# Patient Record
Sex: Female | Born: 1980 | Race: White | Hispanic: No | Marital: Married | Smoking: Former smoker
Health system: Southern US, Community
[De-identification: ages and names within clinical notes are randomized; demographics above are authoritative.]

## PROBLEM LIST (undated history)

## (undated) ENCOUNTER — Inpatient Hospital Stay: Payer: Self-pay

## (undated) DIAGNOSIS — R5383 Other fatigue: Secondary | ICD-10-CM

## (undated) DIAGNOSIS — K5900 Constipation, unspecified: Secondary | ICD-10-CM

## (undated) DIAGNOSIS — R87629 Unspecified abnormal cytological findings in specimens from vagina: Secondary | ICD-10-CM

## (undated) DIAGNOSIS — T8859XA Other complications of anesthesia, initial encounter: Secondary | ICD-10-CM

## (undated) DIAGNOSIS — O24419 Gestational diabetes mellitus in pregnancy, unspecified control: Secondary | ICD-10-CM

## (undated) DIAGNOSIS — T4145XA Adverse effect of unspecified anesthetic, initial encounter: Secondary | ICD-10-CM

## (undated) DIAGNOSIS — I499 Cardiac arrhythmia, unspecified: Secondary | ICD-10-CM

## (undated) DIAGNOSIS — B977 Papillomavirus as the cause of diseases classified elsewhere: Secondary | ICD-10-CM

## (undated) HISTORY — DX: Gestational diabetes mellitus in pregnancy, unspecified control: O24.419

## (undated) HISTORY — DX: Papillomavirus as the cause of diseases classified elsewhere: B97.7

## (undated) HISTORY — DX: Unspecified abnormal cytological findings in specimens from vagina: R87.629

## (undated) HISTORY — DX: Constipation, unspecified: K59.00

## (undated) HISTORY — DX: Other fatigue: R53.83

---

## 2001-04-04 ENCOUNTER — Other Ambulatory Visit: Admission: RE | Admit: 2001-04-04 | Discharge: 2001-04-04 | Payer: Self-pay | Admitting: Family Medicine

## 2002-05-21 HISTORY — PX: TONSILLECTOMY: SUR1361

## 2004-08-03 ENCOUNTER — Ambulatory Visit: Payer: Self-pay | Admitting: Unknown Physician Specialty

## 2005-11-04 ENCOUNTER — Other Ambulatory Visit: Payer: Self-pay

## 2005-11-04 ENCOUNTER — Emergency Department: Payer: Self-pay | Admitting: Unknown Physician Specialty

## 2006-01-24 ENCOUNTER — Ambulatory Visit: Payer: Self-pay | Admitting: Unknown Physician Specialty

## 2008-03-16 ENCOUNTER — Ambulatory Visit: Payer: Self-pay

## 2008-11-18 ENCOUNTER — Observation Stay: Payer: Self-pay | Admitting: Obstetrics & Gynecology

## 2009-03-16 ENCOUNTER — Observation Stay: Payer: Self-pay | Admitting: Unknown Physician Specialty

## 2009-03-17 ENCOUNTER — Inpatient Hospital Stay: Payer: Self-pay | Admitting: Obstetrics and Gynecology

## 2011-07-21 ENCOUNTER — Ambulatory Visit: Payer: Self-pay | Admitting: Internal Medicine

## 2014-08-18 ENCOUNTER — Encounter: Payer: Self-pay | Admitting: *Deleted

## 2014-12-30 ENCOUNTER — Other Ambulatory Visit: Payer: Self-pay | Admitting: Obstetrics and Gynecology

## 2014-12-30 ENCOUNTER — Other Ambulatory Visit: Payer: 59

## 2014-12-30 DIAGNOSIS — N926 Irregular menstruation, unspecified: Secondary | ICD-10-CM

## 2014-12-31 LAB — BETA HCG QUANT (REF LAB): HCG QUANT: 30 m[IU]/mL

## 2015-01-04 ENCOUNTER — Other Ambulatory Visit: Payer: Self-pay | Admitting: Obstetrics and Gynecology

## 2015-01-04 DIAGNOSIS — N926 Irregular menstruation, unspecified: Secondary | ICD-10-CM

## 2015-01-18 ENCOUNTER — Ambulatory Visit: Payer: 59

## 2015-01-18 DIAGNOSIS — N926 Irregular menstruation, unspecified: Secondary | ICD-10-CM

## 2015-02-01 ENCOUNTER — Other Ambulatory Visit: Payer: 59

## 2015-02-01 ENCOUNTER — Other Ambulatory Visit: Payer: Self-pay | Admitting: Obstetrics and Gynecology

## 2015-02-01 ENCOUNTER — Ambulatory Visit: Payer: 59

## 2015-02-01 VITALS — BP 96/62 | HR 88 | Wt 151.8 lb

## 2015-02-01 DIAGNOSIS — N926 Irregular menstruation, unspecified: Secondary | ICD-10-CM

## 2015-02-01 DIAGNOSIS — Z3491 Encounter for supervision of normal pregnancy, unspecified, first trimester: Secondary | ICD-10-CM

## 2015-02-01 NOTE — Progress Notes (Unsigned)
NOB nurse intake, all info was given to patient and all questions answered.  Panaroma info was given and she will let us know if she would like test.  Pt hasn't traveled outside of the [place], [place] info given

## 2015-02-02 LAB — CBC WITH DIFFERENTIAL/PLATELET
Basophils Absolute: 0 10*3/uL (ref 0.0–0.2)
Basos: 0 %
EOS (ABSOLUTE): 0.2 10*3/uL (ref 0.0–0.4)
EOS: 2 %
HEMATOCRIT: 38.5 % (ref 34.0–46.6)
HEMOGLOBIN: 12.8 g/dL (ref 11.1–15.9)
IMMATURE GRANS (ABS): 0 10*3/uL (ref 0.0–0.1)
Immature Granulocytes: 0 %
LYMPHS ABS: 2.4 10*3/uL (ref 0.7–3.1)
LYMPHS: 29 %
MCH: 30 pg (ref 26.6–33.0)
MCHC: 33.2 g/dL (ref 31.5–35.7)
MCV: 90 fL (ref 79–97)
Monocytes Absolute: 0.6 10*3/uL (ref 0.1–0.9)
Monocytes: 7 %
Neutrophils Absolute: 5.1 10*3/uL (ref 1.4–7.0)
Neutrophils: 62 %
Platelets: 317 10*3/uL (ref 150–379)
RBC: 4.26 x10E6/uL (ref 3.77–5.28)
RDW: 13.4 % (ref 12.3–15.4)
WBC: 8.4 10*3/uL (ref 3.4–10.8)

## 2015-02-02 LAB — URINALYSIS, ROUTINE W REFLEX MICROSCOPIC
Bilirubin, UA: NEGATIVE
GLUCOSE, UA: NEGATIVE
Ketones, UA: NEGATIVE
Leukocytes, UA: NEGATIVE
NITRITE UA: NEGATIVE
PH UA: 6 (ref 5.0–7.5)
RBC, UA: NEGATIVE
Specific Gravity, UA: >=1.03 — AB (ref 1.005–1.030)
UUROB: 0.2 mg/dL (ref 0.2–1.0)

## 2015-02-02 LAB — HEP, RPR, HIV PANEL
HEP B S AG: NEGATIVE
HIV SCREEN 4TH GENERATION: NONREACTIVE
RPR: NONREACTIVE

## 2015-02-02 LAB — VARICELLA ZOSTER ANTIBODY, IGG: VARICELLA: 1097 {index} (ref >165)

## 2015-02-02 LAB — ABO AND RH: Rh Factor: POSITIVE

## 2015-02-02 LAB — ANTIBODY SCREEN: Antibody Screen: NEGATIVE

## 2015-02-02 LAB — RUBELLA SCREEN: RUBELLA: 2.36 {index} (ref >0.99)

## 2015-02-03 LAB — GC/CHLAMYDIA PROBE AMP
Chlamydia trachomatis, NAA: NEGATIVE
Neisseria gonorrhoeae by PCR: NEGATIVE

## 2015-02-03 LAB — URINE CULTURE: ORGANISM ID, BACTERIA: NO GROWTH

## 2015-02-04 ENCOUNTER — Telehealth: Payer: Self-pay | Admitting: *Deleted

## 2015-02-11 NOTE — Telephone Encounter (Signed)
Discussed with pt about her nausea, she voiced understanding

## 2015-02-16 ENCOUNTER — Ambulatory Visit (INDEPENDENT_AMBULATORY_CARE_PROVIDER_SITE_OTHER): Payer: 59 | Admitting: Obstetrics and Gynecology

## 2015-02-16 ENCOUNTER — Encounter: Payer: Self-pay | Admitting: Obstetrics and Gynecology

## 2015-02-16 VITALS — BP 104/66 | HR 98 | Wt 156.0 lb

## 2015-02-16 DIAGNOSIS — O98311 Other infections with a predominantly sexual mode of transmission complicating pregnancy, first trimester: Secondary | ICD-10-CM

## 2015-02-16 DIAGNOSIS — A630 Anogenital (venereal) warts: Secondary | ICD-10-CM | POA: Diagnosis not present

## 2015-02-16 DIAGNOSIS — O98511 Other viral diseases complicating pregnancy, first trimester: Secondary | ICD-10-CM | POA: Diagnosis not present

## 2015-02-16 DIAGNOSIS — Z3491 Encounter for supervision of normal pregnancy, unspecified, first trimester: Secondary | ICD-10-CM | POA: Diagnosis not present

## 2015-02-16 LAB — POCT URINALYSIS DIPSTICK
Bilirubin, UA: NEGATIVE
Blood, UA: NEGATIVE
GLUCOSE UA: NEGATIVE
Ketones, UA: NEGATIVE
LEUKOCYTES UA: NEGATIVE
Nitrite, UA: NEGATIVE
PROTEIN UA: NEGATIVE
SPEC GRAV UA: 1.01
UROBILINOGEN UA: 0.2
pH, UA: 6

## 2015-02-16 NOTE — Progress Notes (Signed)
ROB- ob work in pt c/o ? Genital warts

## 2015-02-21 NOTE — Progress Notes (Signed)
GYNECOLOGY CLINIC PROGRESS NOTE  Subjective:     Lauren Davis is a 34 y.o. G41P1001 female at 10.[redacted] weeks gestation who presents for evaluation of an abnormal vaginal lesion. Symptoms have been present for 1 day. Notes that after showering she could palpate a small raised area on right labia minora. Vaginal symptoms: none.  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   The following portions of the patient's history were reviewed and updated as appropriate: allergies, current medications, past family history, past medical history, past social history, past surgical history and problem list.   Review of Systems Pertinent items noted in HPI and remainder of comprehensive ROS otherwise negative.    Objective:    BP 104/66 mmHg  Pulse 98  Wt 156 lb (70.761 kg)  LMP 12/08/2014 General appearance: alert and no distress Pelvic: external genitalia normal, positive findings: 2 small genital warts present, ~ 3 mm in size. , rectovaginal septum normal and urethra without abnormality or discharge.  Internal exam not done.  Extremities: extremities normal, atraumatic, no cyanosis or edema  Neuro: Grossly normal.    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    Plan:   Discussed that lesions were very small, not likely to grow during pregnancy.  Patient notes understanding, however still adamant about removal of warts. Procedure details as follows:      1) Area numbed with topical Hurricaine gel.     2) Excision of warts using sharp dissection.      3) Silver nitrate stick placed at area of incision for hemostasis.  Patient tolerated procedure well.  Symptomatic local care discussed.   Patient to f/u in 2 weeks for NOB visit as previously scheduled.  F/u sooner if needed.    Hildred Laser, MD [facility]

## 2015-02-22 ENCOUNTER — Ambulatory Visit (INDEPENDENT_AMBULATORY_CARE_PROVIDER_SITE_OTHER): Payer: 59 | Admitting: Obstetrics and Gynecology

## 2015-02-22 ENCOUNTER — Encounter: Payer: Self-pay | Admitting: Obstetrics and Gynecology

## 2015-02-22 VITALS — BP 123/64 | HR 85 | Ht 69.0 in | Wt 153.0 lb

## 2015-02-22 DIAGNOSIS — Z3491 Encounter for supervision of normal pregnancy, unspecified, first trimester: Secondary | ICD-10-CM

## 2015-02-22 NOTE — Progress Notes (Signed)
NOB physical, pt still c/o nausea and fatigue

## 2015-03-04 ENCOUNTER — Other Ambulatory Visit: Payer: Self-pay | Admitting: Obstetrics and Gynecology

## 2015-03-04 ENCOUNTER — Encounter: Payer: Self-pay | Admitting: Obstetrics and Gynecology

## 2015-03-18 ENCOUNTER — Encounter: Payer: Self-pay | Admitting: Obstetrics and Gynecology

## 2015-03-18 ENCOUNTER — Ambulatory Visit (INDEPENDENT_AMBULATORY_CARE_PROVIDER_SITE_OTHER): Payer: 59 | Admitting: Obstetrics and Gynecology

## 2015-03-18 VITALS — BP 110/66 | HR 88 | Ht 69.0 in | Wt 159.4 lb

## 2015-03-18 DIAGNOSIS — O98312 Other infections with a predominantly sexual mode of transmission complicating pregnancy, second trimester: Secondary | ICD-10-CM

## 2015-03-18 DIAGNOSIS — Z3492 Encounter for supervision of normal pregnancy, unspecified, second trimester: Secondary | ICD-10-CM

## 2015-03-18 DIAGNOSIS — A630 Anogenital (venereal) warts: Secondary | ICD-10-CM

## 2015-03-18 LAB — POCT URINALYSIS DIPSTICK
Bilirubin, UA: NEGATIVE
GLUCOSE UA: NEGATIVE
Ketones, UA: NEGATIVE
Leukocytes, UA: NEGATIVE
Nitrite, UA: NEGATIVE
Protein, UA: NEGATIVE
RBC UA: NEGATIVE
SPEC GRAV UA: 1.015
UROBILINOGEN UA: 0.2
pH, UA: 6.5

## 2015-03-18 MED ORDER — PODOFILOX 0.5 % EX GEL: Freq: Two times a day (BID) | CUTANEOUS | Status: DC

## 2015-03-18 NOTE — Progress Notes (Signed)
Patient ID: Flossie Dibbleina L Auriemma, female   DOB: 01/14/1981, 34 y.o.   MRN: [id number]  HPI: genital wart that has reappeared. Previously removed by Dr Valentino Saxonherry 2 weeks ago  A: Pelvic exam: VULVA: normal appearing vulva with no masses, tenderness or lesions, with one area right lower labial warts P: TCA 80% applied will add Folic Acid 1 gm daily Rx for condylox get next week  RTC prn  Lorrin Bodner Burr,CNM

## 2015-03-20 ENCOUNTER — Observation Stay
Admission: RE | Admit: 2015-03-20 | Discharge: 2015-03-20 | Disposition: A | Discharge status: Home / Self Care | Payer: 59 | Attending: Obstetrics and Gynecology | Admitting: Obstetrics and Gynecology

## 2015-03-20 DIAGNOSIS — Z3A14 14 weeks gestation of pregnancy: Secondary | ICD-10-CM | POA: Diagnosis not present

## 2015-03-20 DIAGNOSIS — O4690 Antepartum hemorrhage, unspecified, unspecified trimester: Secondary | ICD-10-CM | POA: Diagnosis present

## 2015-03-20 DIAGNOSIS — O468X2 Other antepartum hemorrhage, second trimester: Secondary | ICD-10-CM | POA: Diagnosis not present

## 2015-03-20 MED ORDER — DOCUSATE SODIUM 100 MG PO CAPS
100.0000 mg | ORAL_CAPSULE | Freq: Every day | ORAL | Status: DC
Start: 2015-03-20 — End: 2015-03-20

## 2015-03-20 MED ORDER — ZOLPIDEM TARTRATE 5 MG PO TABS: 5.0000 mg | ORAL_TABLET | Freq: Every evening | ORAL | Status: DC | PRN

## 2015-03-20 MED ORDER — PRENATAL MULTIVITAMIN CH: 1.0000 | ORAL_TABLET | Freq: Every day | ORAL | Status: DC

## 2015-03-20 MED ORDER — ACETAMINOPHEN 325 MG PO TABS: 650.0000 mg | ORAL_TABLET | ORAL | Status: DC | PRN

## 2015-03-20 MED ORDER — CALCIUM CARBONATE ANTACID 500 MG PO CHEW: 2.0000 | CHEWABLE_TABLET | ORAL | Status: DC | PRN

## 2015-03-20 NOTE — OB Triage Note (Signed)
Dr D talked with pt. To se  On Monday

## 2015-03-20 NOTE — Final Progress Note (Signed)
Physician Final Progress Note  Patient ID: Lauren Davis MRN: [id number] DOB/AGE: 34/06/1980 34 y.o.  Admit date: 03/20/2015 Admitting provider: Herold HarmsMartin A Dequane Strahan, MD Discharge date: 03/20/2015   Admission Diagnoses:  1. 14.4 week IUP 2. Vaginal bleeding and cramping  Discharge Diagnoses:  Firrst Trimester Bleeding, history of Fetal well being confirmed  Consults: None   Significant Findings/ Diagnostic Studies:  U/S: +FM; [id number] PE: Sterile Speculum-No Bleeding; Cervix: Long/Closed/nontender; Uterus 15 week size, non tender A+ Blood type  Procedures: none  Discharge Condition: good  Disposition:   Diet: Regular diet  Discharge Activity: Activity as tolerated; SAB/CI precautions given     Medication List    ASK your doctor about these medications        docusate sodium 100 MG capsule  Commonly known as:  COLACE  Take 100 mg by mouth 2 (two) times daily.     podofilox 0.5 % gel  Commonly known as:  CONDYLOX  Apply topically 2 (two) times daily.     prenatal multivitamin Tabs tablet  Take 1 tablet by mouth daily at 12 noon.         Total time spent taking care of this patient: 20 minutes  Signed: Prentice DockerMartin A Aman Bonet 03/20/2015, 2:46 PM

## 2015-03-29 ENCOUNTER — Ambulatory Visit (INDEPENDENT_AMBULATORY_CARE_PROVIDER_SITE_OTHER): Payer: 59 | Admitting: Obstetrics and Gynecology

## 2015-03-29 ENCOUNTER — Encounter: Payer: 59 | Admitting: Obstetrics and Gynecology

## 2015-03-29 ENCOUNTER — Encounter: Payer: Self-pay | Admitting: Obstetrics and Gynecology

## 2015-03-29 VITALS — BP 110/62 | HR 87 | Wt 157.4 lb

## 2015-03-29 DIAGNOSIS — Z3492 Encounter for supervision of normal pregnancy, unspecified, second trimester: Secondary | ICD-10-CM

## 2015-03-29 LAB — POCT URINALYSIS DIPSTICK
BILIRUBIN UA: NEGATIVE
GLUCOSE UA: NEGATIVE
Ketones, UA: NEGATIVE
Leukocytes, UA: NEGATIVE
Nitrite, UA: NEGATIVE
PH UA: 6.5
Protein, UA: NEGATIVE
RBC UA: NEGATIVE
SPEC GRAV UA: 1.015
Urobilinogen, UA: 0.2

## 2015-03-29 NOTE — Progress Notes (Signed)
ROB-pt currently has a "cold", nasal drainage and coughing-she is taking sudafed x 1 week

## 2015-03-29 NOTE — Patient Instructions (Signed)
Vaginal Bleeding During Pregnancy, Second Trimester A small amount of bleeding (spotting) from the vagina is relatively common in pregnancy. It usually stops on its own. Various things can cause bleeding or spotting in pregnancy. Some bleeding may be related to the pregnancy, and some may not. Sometimes the bleeding is normal and is not a problem. However, bleeding can also be a sign of something serious. Be sure to tell your health care provider about any vaginal bleeding right away. Some possible causes of vaginal bleeding during the second trimester include:  Infection, inflammation, or growths on the cervix.   The placenta may be partially or completely covering the opening of the cervix inside the uterus (placenta previa).  The placenta may have separated from the uterus (abruption of the placenta).   You may be having early (preterm) labor.   The cervix may not be strong enough to keep a baby inside the uterus (cervical insufficiency).   Tiny cysts may have developed in the uterus instead of pregnancy tissue (molar pregnancy). HOME CARE INSTRUCTIONS  Watch your condition for any changes. The following actions may help to lessen any discomfort you are feeling:  Follow your health care provider's instructions for limiting your activity. If your health care provider orders bed rest, you may need to stay in bed and only get up to use the bathroom. However, your health care provider may allow you to continue light activity.  If needed, make plans for someone to help with your regular activities and responsibilities while you are on bed rest.  Keep track of the number of pads you use each day, how often you change pads, and how soaked (saturated) they are. Write this down.  Do not use tampons. Do not douche.  Do not have sexual intercourse or orgasms until approved by your health care provider.  If you pass any tissue from your vagina, save the tissue so you can show it to your  health care provider.  Only take over-the-counter or prescription medicines as directed by your health care provider.  Do not take aspirin because it can make you bleed.  Do not exercise or perform any strenuous activities or heavy lifting without your health care provider's permission.  Keep all follow-up appointments as directed by your health care provider. SEEK MEDICAL CARE IF:  You have any vaginal bleeding during any part of your pregnancy.  You have cramps or labor pains.  You have a fever, not controlled by medicine. SEEK IMMEDIATE MEDICAL CARE IF:   You have severe cramps in your back or belly (abdomen).  You have contractions.  You have chills.  You pass large clots or tissue from your vagina.  Your bleeding increases.  You feel light-headed or weak, or you have fainting episodes.  You are leaking fluid or have a gush of fluid from your vagina. MAKE SURE YOU:  Understand these instructions.  Will watch your condition.  Will get help right away if you are not doing well or get worse.   This information is not intended to replace advice given to you by your health care provider. Make sure you discuss any questions you have with your health care provider.   Document Released: 02/14/2005 Document Revised: 05/12/2013 Document Reviewed: 01/12/2013 Elsevier Interactive Patient Education 2016 Elsevier Inc.  

## 2015-03-29 NOTE — Progress Notes (Signed)
ROB- Ok to keep taking sudafed as needed, bleeding precautions discussed; anatomy scan next visit

## 2015-04-28 ENCOUNTER — Encounter: Payer: Self-pay | Admitting: Obstetrics and Gynecology

## 2015-04-28 ENCOUNTER — Ambulatory Visit (INDEPENDENT_AMBULATORY_CARE_PROVIDER_SITE_OTHER): Payer: 59

## 2015-04-28 ENCOUNTER — Ambulatory Visit (INDEPENDENT_AMBULATORY_CARE_PROVIDER_SITE_OTHER): Payer: 59 | Admitting: Obstetrics and Gynecology

## 2015-04-28 VITALS — BP 113/69 | HR 89 | Wt 163.0 lb

## 2015-04-28 DIAGNOSIS — Z3492 Encounter for supervision of normal pregnancy, unspecified, second trimester: Secondary | ICD-10-CM

## 2015-04-28 LAB — POCT URINALYSIS DIPSTICK
Bilirubin, UA: NEGATIVE
Blood, UA: NEGATIVE
GLUCOSE UA: NEGATIVE
KETONES UA: NEGATIVE
Leukocytes, UA: NEGATIVE
Nitrite, UA: NEGATIVE
Protein, UA: NEGATIVE
SPEC GRAV UA: 1.01
Urobilinogen, UA: 0.2
pH, UA: 7

## 2015-04-28 NOTE — Progress Notes (Signed)
Indications:  Anatomy Findings:  Singleton intrauterine pregnancy is visualized with FHR at 144 BPM. Biometrics give an (U/S) Gestational age of [redacted] weeks and 4 days, and an (U/S) EDD of 09/11/15; this correlates with the clinically established EDD of 09/10/15.  Fetal presentation is vertex, spine posterior.  EFW: 349 grams ( 0 lbs. 12 oz. ). Placenta: Anterior, grade 0 and remote to cervix AFI: Adequate with MVP of 4.4 cm.  Anatomic survey is complete and normal; Gender - female.   Right Ovary measures 3.2 x 1.7 x 2.2 cm. It is normal in appearance. Left Ovary measures 3.2 x 2.2 x 3.0 cm. It is normal appearance. There is no evidence of a corpus luteal cyst. Survey of the adnexa demonstrates no adnexal masses. There is no free peritoneal fluid in the cul de sac.  Impression: 1. 20 weeks 4 day Viable Singleton Intrauterine pregnancy by U/S. 2. (U/S) EDD is consistent with Clinically established (LMP) EDD of 09/10/15. 3. Normal appearing anatomy scan.

## 2015-04-28 NOTE — Progress Notes (Signed)
ROB-pt had anatomy scan today, had episode of dizziness yesterday

## 2015-05-06 ENCOUNTER — Other Ambulatory Visit: Payer: Self-pay | Admitting: *Deleted

## 2015-05-06 ENCOUNTER — Other Ambulatory Visit: Payer: 59

## 2015-05-06 DIAGNOSIS — M545 Low back pain, unspecified: Secondary | ICD-10-CM

## 2015-05-06 DIAGNOSIS — O26892 Other specified pregnancy related conditions, second trimester: Principal | ICD-10-CM

## 2015-05-07 LAB — URINE CULTURE: ORGANISM ID, BACTERIA: NO GROWTH

## 2015-05-22 NOTE — L&D Delivery Note (Signed)
Delivery Summary for Lauren Davis  Labor Events:   Preterm labor:   Rupture date:   Rupture time:   Rupture type: Artificial  Fluid Color: Clear  Induction:   Augmentation:   Complications:   Cervical ripening:          Delivery:   Episiotomy:   Lacerations:   Repair suture:   Repair # of packets:   Blood loss (ml): 350   Information for the patient's newborn:  Lavada MesiBrannock, Collins Rose [[id number]][[id number]]    Delivery 09/05/2015 10:10 PM by  Vaginal, Spontaneous Delivery Sex:  female Gestational Age: 1766w2d Delivery Clinician:  Melody N Shambley Living?: Yes        APGARS  One minute Five minutes Ten minutes  Skin color: 0   1      Heart rate: 2   2      Grimace: 2   2      Muscle tone: 2   2      Breathing: 2   2      Totals: 8  9      Presentation/position:      Resuscitation: None  Cord information:    Disposition of cord blood:     Blood gases sent?  Complications:   Placenta: Delivered: 09/05/2015 10:24 PM  Spontaneous  Intact appearance Newborn Measurements: Weight: 7 lb 12 oz (3515 g)  Height: 20.5"  Head circumference:    Chest circumference:    Other providers: Delivery Nurse Transition RN Joy L Ardelle AntonWagoner Clifton JamesSusan K Frey  Additional  information: Forceps:   Vacuum:   Breech:   Observed anomalies           Delivery Note At  a viable and healthy female "Collins" was delivered via  (Presentation:OA ;  Vertex).  APGAR:8 ,9 ; weight 3515 grams .   Placenta status: delivered intact with three vessel Cord:  with the following complications:none. Anesthesia:   Episiotomy:  none Lacerations:  2nd degree Suture Repair: 3.0 vicryl rapide Est. Blood Loss (mL):  350  Mom to postpartum.  Baby to Couplet care / Skin to Skin.  Melody NIKE Shambley, CNM 09/05/2015, 10:41 PM

## 2015-05-26 ENCOUNTER — Ambulatory Visit (INDEPENDENT_AMBULATORY_CARE_PROVIDER_SITE_OTHER): Payer: 59 | Admitting: Obstetrics and Gynecology

## 2015-05-26 ENCOUNTER — Encounter: Payer: 59 | Admitting: Obstetrics and Gynecology

## 2015-05-26 ENCOUNTER — Encounter: Payer: Self-pay | Admitting: Obstetrics and Gynecology

## 2015-05-26 VITALS — BP 110/69 | HR 95 | Wt 168.5 lb

## 2015-05-26 DIAGNOSIS — Z331 Pregnant state, incidental: Secondary | ICD-10-CM

## 2015-05-26 LAB — POCT URINALYSIS DIPSTICK
Bilirubin, UA: NEGATIVE
Blood, UA: NEGATIVE
Glucose, UA: NEGATIVE
KETONES UA: NEGATIVE
LEUKOCYTES UA: NEGATIVE
Nitrite, UA: NEGATIVE
PROTEIN UA: NEGATIVE
Spec Grav, UA: 1.01
UROBILINOGEN UA: 0.2
pH, UA: 6

## 2015-05-26 NOTE — Progress Notes (Signed)
ROB-doing well, glucola next visit. 

## 2015-05-26 NOTE — Progress Notes (Signed)
ROB-pt denies any complaints 

## 2015-06-20 ENCOUNTER — Other Ambulatory Visit: Payer: Self-pay | Admitting: *Deleted

## 2015-06-20 DIAGNOSIS — Z331 Pregnant state, incidental: Secondary | ICD-10-CM

## 2015-06-20 DIAGNOSIS — Z131 Encounter for screening for diabetes mellitus: Secondary | ICD-10-CM

## 2015-06-21 ENCOUNTER — Encounter: Payer: Self-pay | Admitting: Obstetrics and Gynecology

## 2015-06-21 ENCOUNTER — Ambulatory Visit (INDEPENDENT_AMBULATORY_CARE_PROVIDER_SITE_OTHER): Payer: 59 | Admitting: Obstetrics and Gynecology

## 2015-06-21 VITALS — BP 98/68 | HR 98 | Wt 171.0 lb

## 2015-06-21 DIAGNOSIS — Z23 Encounter for immunization: Secondary | ICD-10-CM | POA: Diagnosis not present

## 2015-06-21 DIAGNOSIS — Z331 Pregnant state, incidental: Secondary | ICD-10-CM | POA: Diagnosis not present

## 2015-06-21 LAB — POCT URINALYSIS DIPSTICK
Bilirubin, UA: NEGATIVE
Blood, UA: NEGATIVE
GLUCOSE UA: NEGATIVE
KETONES UA: NEGATIVE
Leukocytes, UA: NEGATIVE
Nitrite, UA: NEGATIVE
SPEC GRAV UA: 1.015
Urobilinogen, UA: 0.2
pH, UA: 7

## 2015-06-21 MED ORDER — TETANUS-DIPHTH-ACELL PERTUSSIS 5-2.5-18.5 LF-MCG/0.5 IM SUSP
0.5000 mL | Freq: Once | INTRAMUSCULAR | Status: AC
  Administered 2015-06-21: 0.5 mL via INTRAMUSCULAR

## 2015-06-21 NOTE — Progress Notes (Signed)
ROB- doing well except intermittent vaginal irritation- used terazol with some relief then burning, Microscopic wet-mount exam shows negative for pathogens, normal epithelial cells, pH 4.5. Reassured; Blood consent signed, Tdap given, and info on cord blood donation discussed. Will do glucola one day this week.

## 2015-06-21 NOTE — Progress Notes (Signed)
OB work in- pt is c/o vaginal itching outside, otherwise pt is doing well

## 2015-06-22 ENCOUNTER — Other Ambulatory Visit: Payer: 59

## 2015-06-22 DIAGNOSIS — Z131 Encounter for screening for diabetes mellitus: Secondary | ICD-10-CM | POA: Diagnosis not present

## 2015-06-23 ENCOUNTER — Encounter: Payer: 59 | Admitting: Obstetrics and Gynecology

## 2015-06-23 ENCOUNTER — Other Ambulatory Visit: Payer: Self-pay | Admitting: Obstetrics and Gynecology

## 2015-06-23 DIAGNOSIS — R7309 Other abnormal glucose: Secondary | ICD-10-CM | POA: Insufficient documentation

## 2015-06-23 LAB — GLUCOSE, 1 HOUR GESTATIONAL: Gestational Diabetes Screen: 154 mg/dL — ABNORMAL HIGH (ref 65–139)

## 2015-06-23 LAB — HEMOGLOBIN AND HEMATOCRIT, BLOOD
HEMATOCRIT: 36.4 % (ref 34.0–46.6)
HEMOGLOBIN: 12.2 g/dL (ref 11.1–15.9)

## 2015-06-24 ENCOUNTER — Other Ambulatory Visit: Payer: 59

## 2015-06-24 ENCOUNTER — Other Ambulatory Visit: Payer: Self-pay | Admitting: Obstetrics and Gynecology

## 2015-06-24 DIAGNOSIS — R7309 Other abnormal glucose: Secondary | ICD-10-CM

## 2015-06-24 DIAGNOSIS — Z131 Encounter for screening for diabetes mellitus: Secondary | ICD-10-CM | POA: Diagnosis not present

## 2015-06-27 LAB — GLUCOSE, 1 HOUR GESTATIONAL

## 2015-06-28 ENCOUNTER — Other Ambulatory Visit: Payer: Self-pay | Admitting: Obstetrics and Gynecology

## 2015-06-28 DIAGNOSIS — O24419 Gestational diabetes mellitus in pregnancy, unspecified control: Secondary | ICD-10-CM | POA: Insufficient documentation

## 2015-06-28 LAB — GESTATIONAL GLUCOSE TOLERANCE
GLUCOSE 1 HOUR GTT: 175 mg/dL (ref 65–179)
GLUCOSE 2 HOUR GTT: 169 mg/dL — AB (ref 65–154)
GLUCOSE 3 HOUR GTT: 147 mg/dL — AB (ref 65–139)
GLUCOSE FASTING: 76 mg/dL (ref 65–94)

## 2015-06-28 LAB — SPECIMEN STATUS REPORT

## 2015-07-04 ENCOUNTER — Encounter: Payer: Self-pay | Admitting: *Deleted

## 2015-07-04 ENCOUNTER — Encounter: Payer: 59 | Attending: Obstetrics and Gynecology | Admitting: *Deleted

## 2015-07-04 VITALS — BP 102/68 | Ht 69.0 in | Wt 174.7 lb

## 2015-07-04 DIAGNOSIS — O24419 Gestational diabetes mellitus in pregnancy, unspecified control: Secondary | ICD-10-CM | POA: Insufficient documentation

## 2015-07-04 DIAGNOSIS — O24410 Gestational diabetes mellitus in pregnancy, diet controlled: Secondary | ICD-10-CM

## 2015-07-04 DIAGNOSIS — H1031 Unspecified acute conjunctivitis, right eye: Secondary | ICD-10-CM | POA: Diagnosis not present

## 2015-07-04 NOTE — Patient Instructions (Signed)
Read booklet on Gestational Diabetes Follow Gestational Meal Planning Guidelines Complete a 3 Day Food Record and bring to next appointment Check blood sugars 4 x day - before breakfast and 2 hrs after every meal and record  Bring blood sugar log to all appointments Call MD for prescription for meter strips and lancets Strips True Metrix Lancets   True Metrix Purchase urine ketone strips if blood sugars not controlled and check urine ketones every am:  If + increase bedtime snack to 1 protein and 2 carbohydrate servings Walk 20-30 minutes at least 5 x week if permitted by MD

## 2015-07-05 ENCOUNTER — Ambulatory Visit (INDEPENDENT_AMBULATORY_CARE_PROVIDER_SITE_OTHER): Payer: 59 | Admitting: Obstetrics and Gynecology

## 2015-07-05 ENCOUNTER — Encounter: Payer: Self-pay | Admitting: Obstetrics and Gynecology

## 2015-07-05 VITALS — BP 118/58 | HR 88 | Wt 172.6 lb

## 2015-07-05 DIAGNOSIS — Z3490 Encounter for supervision of normal pregnancy, unspecified, unspecified trimester: Secondary | ICD-10-CM

## 2015-07-05 DIAGNOSIS — Z331 Pregnant state, incidental: Secondary | ICD-10-CM

## 2015-07-05 LAB — POCT URINALYSIS DIPSTICK
Bilirubin, UA: NEGATIVE
Blood, UA: NEGATIVE
Glucose, UA: NEGATIVE
KETONES UA: NEGATIVE
Nitrite, UA: NEGATIVE
PROTEIN UA: NEGATIVE
Spec Grav, UA: 1.015
Urobilinogen, UA: NEGATIVE
pH, UA: 7

## 2015-07-05 NOTE — Progress Notes (Signed)
Diabetes Self-Management Education  Visit Type: First/Initial  Appt. Start Time: 1520 Appt. End Time: 1700  07/05/2015  [name], identified by name and date of birth, is a 35 y.o. female with a diagnosis of Diabetes: Gestational Diabetes.   ASSESSMENT  Blood pressure 102/68, height  (1.753 m), weight 174 lb 11.2 oz (79.243 kg), last menstrual period 12/08/2014. Body mass index is 25.79 kg/(m^2).      Diabetes Self-Management Education - 07/04/15 1724    Visit Information   Visit Type First/Initial   Initial Visit   Diabetes Type Gestational Diabetes   Are you currently following a meal plan? Yes   What type of meal plan do you follow? low carb   Are you taking your medications as prescribed? Yes   Date Diagnosed February 2017   Health Coping   How would you rate your overall health? Good   Psychosocial Assessment   Patient Belief/Attitude about Diabetes Motivated to manage diabetes  "worried"   Self-care barriers None   Self-management support Doctor's office;Family   Patient Concerns Nutrition/Meal planning;Problem Solving;Healthy Lifestyle;Glycemic Control   Special Needs None   Preferred Learning Style Auditory;Visual;Hands on   Learning Readiness Change in progress   How often do you need to have someone help you when you read instructions, pamphlets, or other written materials from your doctor or pharmacy? 1 - Never   What is the last grade level you completed in school? 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 0 times/day (not testing)  Provided True Metrix meter and instructed on use. BG upon return demonstration was 68 mg/dL at 1:61 pm - 2 hrs pp.    Have you had a dilated eye exam in the past 12 months? Yes   Have you had a dental exam in the past 12 months? Yes   Are you checking your feet? No   Dietary Intake   Breakfast greek yogurt and berries; eggs and bacon   Snack (morning)  almonds or cheese or veggies and dip   Lunch chicken or egg salad; grilled chicken salad   Snack (afternoon) almonds or cheese or veggies and dip   Dinner grilled chicken, vegetables, taco salad   Snack (evening) whipped cream and berries   Beverage(s) milk, water, unsweetened tea   Exercise   Exercise Type ADL's   Patient Education   Previous Diabetes Education Yes (please comment)  hospital info and radiology school   Disease state  Definition of diabetes, type 1 and 2, and the diagnosis of diabetes   Nutrition management  Role of diet in the treatment of diabetes and the relationship between the three main macronutrients and blood glucose level   Physical activity and exercise  Role of exercise on diabetes management, blood pressure control and cardiac health.   Monitoring Taught/evaluated SMBG meter.;Purpose and frequency of SMBG.;Identified appropriate SMBG and/or A1C goals.   Chronic complications Relationship between chronic complications and blood glucose control   Psychosocial adjustment Identified and addressed patients feelings and concerns about diabetes   Preconception care Pregnancy and GDM  Role of pre-pregnancy blood glucose control on the development of the fetus;Reviewed with patient blood glucose goals with pregnancy   Individualized Goals (developed by patient)   Reducing Risk Improve blood sugars Prevent diabetes complications Lead a healthier lifestyle   Outcomes   Expected Outcomes Demonstrated interest in learning. Expect positive outcomes      Individualized  Plan for Diabetes Self-Management Training:   Learning Objective:  Patient will have a greater understanding of diabetes self-management. Patient education plan is to attend individual and/or group sessions per assessed needs and concerns.   Plan:   Patient Instructions  Read booklet on Gestational Diabetes Follow Gestational Meal Planning Guidelines Complete a 3 Day Food Record and bring to next  appointment Check blood sugars 4 x day - before breakfast and 2 hrs after every meal and record  Bring blood sugar log to all appointments Call MD for prescription for meter strips and lancets Strips True Metrix Lancets   True Metrix Purchase urine ketone strips if blood sugars not controlled and check urine ketones every am:  If + increase bedtime snack to 1 protein and 2 carbohydrate servings Walk 20-30 minutes at least 5 x week if permitted by MD    Expected Outcomes:  Demonstrated interest in learning. Expect positive outcomes  Education material provided:  Gestational Booklet Gestational Meal Planning Guidelines Viewed Gestational Diabetes Video Meter - True Metrix 3 Day Food Record Goals for a Healthy Pregnancy  If problems or questions, patient to contact team via:   Sharion Settler, RN, CCM, CDE [phone]  Future DSME appointment:  Monday February 27 at 1:00 pm with dietitian

## 2015-07-05 NOTE — Progress Notes (Signed)
ROB- doing well, had Lifestyle appt yesterday, FBS 71, and PP 79. To increase protein at each meal, and add one carb.

## 2015-07-06 ENCOUNTER — Encounter: Payer: 59 | Admitting: Obstetrics and Gynecology

## 2015-07-07 ENCOUNTER — Encounter: Payer: 59 | Admitting: Obstetrics and Gynecology

## 2015-07-18 ENCOUNTER — Encounter: Payer: 59 | Admitting: Dietician

## 2015-07-18 ENCOUNTER — Encounter: Payer: Self-pay | Admitting: Dietician

## 2015-07-18 VITALS — Ht 69.0 in | Wt 174.1 lb

## 2015-07-18 DIAGNOSIS — O24419 Gestational diabetes mellitus in pregnancy, unspecified control: Secondary | ICD-10-CM | POA: Diagnosis not present

## 2015-07-18 DIAGNOSIS — O24410 Gestational diabetes mellitus in pregnancy, diet controlled: Secondary | ICD-10-CM

## 2015-07-18 NOTE — Progress Notes (Signed)
   Patient's BG record indicates BGs are within goal ranges.  Patient's food diary indicates carbohydrate-controlled meals and healthy food choices.   Provided 2000kcal meal plan, and wrote individualized menus based on patient's food preferences.  Instructed patient on food safety, including avoidance of Listeriosis, and limiting mercury from fish.  Discussed importance of maintaining healthy lifestyle habits to reduce risk of Type 2 DM as well as Gestational DM with any future pregnancies.  Advised patient to use any remaining testing supplies to test some BGs after delivery, and to have BG tested ideally annually, as well as prior to attempting future pregnancies.

## 2015-07-19 ENCOUNTER — Ambulatory Visit (INDEPENDENT_AMBULATORY_CARE_PROVIDER_SITE_OTHER): Payer: 59

## 2015-07-19 ENCOUNTER — Other Ambulatory Visit: Payer: 59

## 2015-07-19 DIAGNOSIS — Z3490 Encounter for supervision of normal pregnancy, unspecified, unspecified trimester: Secondary | ICD-10-CM

## 2015-07-19 DIAGNOSIS — Z331 Pregnant state, incidental: Secondary | ICD-10-CM | POA: Diagnosis not present

## 2015-07-20 ENCOUNTER — Encounter: Payer: Self-pay | Admitting: Obstetrics and Gynecology

## 2015-07-20 ENCOUNTER — Ambulatory Visit (INDEPENDENT_AMBULATORY_CARE_PROVIDER_SITE_OTHER): Payer: 59 | Admitting: Obstetrics and Gynecology

## 2015-07-20 VITALS — BP 127/64 | HR 83 | Wt 174.0 lb

## 2015-07-20 DIAGNOSIS — Z331 Pregnant state, incidental: Secondary | ICD-10-CM

## 2015-07-20 LAB — POCT URINALYSIS DIPSTICK
Bilirubin, UA: NEGATIVE
GLUCOSE UA: NEGATIVE
Ketones, UA: NEGATIVE
Leukocytes, UA: NEGATIVE
NITRITE UA: NEGATIVE
Protein, UA: NEGATIVE
RBC UA: NEGATIVE
Spec Grav, UA: 1.01
UROBILINOGEN UA: 0.2
pH, UA: 6.5

## 2015-07-20 NOTE — Progress Notes (Signed)
ROB- pt is having a few braxton hicks, otherwise she is feeling great

## 2015-07-20 NOTE — Progress Notes (Signed)
ROB- doing well, FBS all <90, and PP all <110, ok to check one PP daily from here on; pland breast feeding and OCPs PP

## 2015-08-03 ENCOUNTER — Encounter: Payer: Self-pay | Admitting: Obstetrics and Gynecology

## 2015-08-03 ENCOUNTER — Ambulatory Visit (INDEPENDENT_AMBULATORY_CARE_PROVIDER_SITE_OTHER): Payer: 59 | Admitting: Obstetrics and Gynecology

## 2015-08-03 VITALS — BP 129/68 | HR 89 | Wt 174.0 lb

## 2015-08-03 DIAGNOSIS — Z331 Pregnant state, incidental: Secondary | ICD-10-CM

## 2015-08-03 LAB — POCT URINALYSIS DIPSTICK
BILIRUBIN UA: NEGATIVE
Blood, UA: NEGATIVE
GLUCOSE UA: NEGATIVE
KETONES UA: NEGATIVE
LEUKOCYTES UA: NEGATIVE
NITRITE UA: NEGATIVE
PH UA: 6
Protein, UA: NEGATIVE
Spec Grav, UA: 1.01
Urobilinogen, UA: 0.2

## 2015-08-03 NOTE — Progress Notes (Signed)
ROB- FBS all <95 and PP all but one <120, growth scan next visit.

## 2015-08-03 NOTE — Progress Notes (Signed)
ROB-pt denies any complaints, feeling some pressure

## 2015-08-12 ENCOUNTER — Other Ambulatory Visit: Payer: Self-pay | Admitting: Obstetrics and Gynecology

## 2015-08-12 DIAGNOSIS — Z331 Pregnant state, incidental: Secondary | ICD-10-CM

## 2015-08-12 DIAGNOSIS — Z3491 Encounter for supervision of normal pregnancy, unspecified, first trimester: Secondary | ICD-10-CM

## 2015-08-12 DIAGNOSIS — O24419 Gestational diabetes mellitus in pregnancy, unspecified control: Secondary | ICD-10-CM

## 2015-08-17 ENCOUNTER — Other Ambulatory Visit: Payer: Self-pay | Admitting: Obstetrics and Gynecology

## 2015-08-17 ENCOUNTER — Other Ambulatory Visit: Payer: 59

## 2015-08-17 ENCOUNTER — Ambulatory Visit (INDEPENDENT_AMBULATORY_CARE_PROVIDER_SITE_OTHER): Payer: 59

## 2015-08-17 DIAGNOSIS — O24419 Gestational diabetes mellitus in pregnancy, unspecified control: Secondary | ICD-10-CM

## 2015-08-17 DIAGNOSIS — O4103X0 Oligohydramnios, third trimester, not applicable or unspecified: Secondary | ICD-10-CM

## 2015-08-17 DIAGNOSIS — Z331 Pregnant state, incidental: Secondary | ICD-10-CM

## 2015-08-18 ENCOUNTER — Ambulatory Visit (INDEPENDENT_AMBULATORY_CARE_PROVIDER_SITE_OTHER): Payer: 59 | Admitting: Obstetrics and Gynecology

## 2015-08-18 ENCOUNTER — Other Ambulatory Visit: Payer: Self-pay | Admitting: Obstetrics and Gynecology

## 2015-08-18 ENCOUNTER — Encounter: Payer: Self-pay | Admitting: Obstetrics and Gynecology

## 2015-08-18 VITALS — BP 98/68 | HR 98 | Wt 175.1 lb

## 2015-08-18 DIAGNOSIS — Z3491 Encounter for supervision of normal pregnancy, unspecified, first trimester: Secondary | ICD-10-CM | POA: Diagnosis not present

## 2015-08-18 DIAGNOSIS — Z331 Pregnant state, incidental: Secondary | ICD-10-CM | POA: Diagnosis not present

## 2015-08-18 DIAGNOSIS — Z118 Encounter for screening for other infectious and parasitic diseases: Secondary | ICD-10-CM | POA: Diagnosis not present

## 2015-08-18 DIAGNOSIS — Z113 Encounter for screening for infections with a predominantly sexual mode of transmission: Secondary | ICD-10-CM

## 2015-08-18 DIAGNOSIS — Z3685 Encounter for antenatal screening for Streptococcus B: Secondary | ICD-10-CM

## 2015-08-18 DIAGNOSIS — Z36 Encounter for antenatal screening of mother: Secondary | ICD-10-CM

## 2015-08-18 NOTE — Progress Notes (Signed)
ROB -cultures obtained, discussed oligohydramnious and monitoring, to do FKC bid, and will plan IOL at 39 weeks if not delivered by then- scheduled 09/05/15 at 8am- patient aware

## 2015-08-18 NOTE — Progress Notes (Signed)
ROB- cultures obtained, pt was seeing some spots last night, some braxton hicks, some pelvic pressure

## 2015-08-20 LAB — GC/CHLAMYDIA PROBE AMP
CHLAMYDIA, DNA PROBE: NEGATIVE
NEISSERIA GONORRHOEAE BY PCR: NEGATIVE

## 2015-08-20 LAB — STREP GP B NAA: Strep Gp B NAA: NEGATIVE

## 2015-08-26 ENCOUNTER — Encounter: Payer: Self-pay | Admitting: Obstetrics and Gynecology

## 2015-08-26 ENCOUNTER — Ambulatory Visit (INDEPENDENT_AMBULATORY_CARE_PROVIDER_SITE_OTHER): Payer: 59 | Admitting: Obstetrics and Gynecology

## 2015-08-26 ENCOUNTER — Ambulatory Visit (INDEPENDENT_AMBULATORY_CARE_PROVIDER_SITE_OTHER): Payer: 59

## 2015-08-26 ENCOUNTER — Other Ambulatory Visit: Payer: 59

## 2015-08-26 ENCOUNTER — Encounter: Payer: 59 | Admitting: Obstetrics and Gynecology

## 2015-08-26 VITALS — BP 117/75 | HR 87 | Wt 175.7 lb

## 2015-08-26 DIAGNOSIS — O4103X0 Oligohydramnios, third trimester, not applicable or unspecified: Secondary | ICD-10-CM

## 2015-08-26 DIAGNOSIS — Z331 Pregnant state, incidental: Secondary | ICD-10-CM

## 2015-08-26 LAB — POCT URINALYSIS DIPSTICK
BILIRUBIN UA: NEGATIVE
Glucose, UA: NEGATIVE
Ketones, UA: NEGATIVE
LEUKOCYTES UA: NEGATIVE
NITRITE UA: NEGATIVE
PH UA: 6.5
RBC UA: NEGATIVE
Spec Grav, UA: 1.01
UROBILINOGEN UA: 0.2

## 2015-08-26 NOTE — Progress Notes (Signed)
Indications: AFI check for Oligohydramnios Findings:  Fetal presentation is vertex, spine left lateral.  Placenta: anterior, grade 2. AFI: 8.0 cm. This is slightly improved from 7.5 cm last week. Still oligohydramnios as this is just over the 5th percentile for fluid at 38 weeks FHT: 120 bpm.    Impression: 1. Oligohydramnios in 3rd trimester. Today's AFI is 8.0 cm just slightly better than last week at 7.5 cm.   Reports good FKC daily

## 2015-08-26 NOTE — Progress Notes (Signed)
ROB-pt is c/o pelvic pressure, low back pain, having some contractions

## 2015-08-29 ENCOUNTER — Telehealth: Payer: Self-pay | Admitting: Obstetrics and Gynecology

## 2015-08-29 NOTE — Telephone Encounter (Signed)
Lauren Davis needs test strips [facility]

## 2015-08-29 NOTE — Telephone Encounter (Signed)
Done-ac 

## 2015-09-01 ENCOUNTER — Encounter: Payer: Self-pay | Admitting: Obstetrics and Gynecology

## 2015-09-01 ENCOUNTER — Ambulatory Visit (INDEPENDENT_AMBULATORY_CARE_PROVIDER_SITE_OTHER): Payer: 59 | Admitting: Obstetrics and Gynecology

## 2015-09-01 VITALS — BP 108/81 | HR 105 | Wt 175.9 lb

## 2015-09-01 DIAGNOSIS — Z331 Pregnant state, incidental: Secondary | ICD-10-CM

## 2015-09-01 LAB — POCT URINALYSIS DIPSTICK
BILIRUBIN UA: NEGATIVE
GLUCOSE UA: NEGATIVE
KETONES UA: NEGATIVE
Leukocytes, UA: NEGATIVE
Nitrite, UA: NEGATIVE
Protein, UA: NEGATIVE
SPEC GRAV UA: 1.015
Urobilinogen, UA: 0.2
pH, UA: 6

## 2015-09-01 NOTE — Progress Notes (Signed)
ROB- doing well, BS stable

## 2015-09-01 NOTE — Progress Notes (Signed)
ROB- pt is having a lot of pelvic pressure, pressure in her buttocks

## 2015-09-05 ENCOUNTER — Inpatient Hospital Stay: Payer: 59 | Admitting: Anesthesiology

## 2015-09-05 ENCOUNTER — Encounter: Payer: Self-pay | Admitting: Anesthesiology

## 2015-09-05 ENCOUNTER — Inpatient Hospital Stay
Admission: EM | Admit: 2015-09-05 | Discharge: 2015-09-07 | DRG: 775 | Disposition: A | Discharge status: Home / Self Care | Payer: 59 | Attending: Obstetrics and Gynecology | Admitting: Obstetrics and Gynecology

## 2015-09-05 DIAGNOSIS — O24429 Gestational diabetes mellitus in childbirth, unspecified control: Secondary | ICD-10-CM | POA: Diagnosis present

## 2015-09-05 DIAGNOSIS — Z3A39 39 weeks gestation of pregnancy: Secondary | ICD-10-CM | POA: Diagnosis not present

## 2015-09-05 DIAGNOSIS — Z9889 Other specified postprocedural states: Secondary | ICD-10-CM | POA: Diagnosis not present

## 2015-09-05 DIAGNOSIS — Z3483 Encounter for supervision of other normal pregnancy, third trimester: Secondary | ICD-10-CM | POA: Diagnosis not present

## 2015-09-05 DIAGNOSIS — Z833 Family history of diabetes mellitus: Secondary | ICD-10-CM | POA: Diagnosis not present

## 2015-09-05 DIAGNOSIS — Z79899 Other long term (current) drug therapy: Secondary | ICD-10-CM | POA: Diagnosis not present

## 2015-09-05 DIAGNOSIS — Z882 Allergy status to sulfonamides status: Secondary | ICD-10-CM

## 2015-09-05 DIAGNOSIS — Z8249 Family history of ischemic heart disease and other diseases of the circulatory system: Secondary | ICD-10-CM | POA: Diagnosis not present

## 2015-09-05 DIAGNOSIS — O4103X0 Oligohydramnios, third trimester, not applicable or unspecified: Secondary | ICD-10-CM | POA: Diagnosis present

## 2015-09-05 LAB — CBC
HCT: 37.2 % (ref 35.0–47.0)
Hemoglobin: 12.6 g/dL (ref 12.0–16.0)
MCH: 30.6 pg (ref 26.0–34.0)
MCHC: 33.9 g/dL (ref 32.0–36.0)
MCV: 90.1 fL (ref 80.0–100.0)
PLATELETS: 232 10*3/uL (ref 150–440)
RBC: 4.13 MIL/uL (ref 3.80–5.20)
RDW: 13.8 % (ref 11.5–14.5)
WBC: 10.9 10*3/uL (ref 3.6–11.0)

## 2015-09-05 LAB — ABO/RH: ABO/RH(D): A POS

## 2015-09-05 LAB — TYPE AND SCREEN
ABO/RH(D): A POS
Antibody Screen: NEGATIVE

## 2015-09-05 LAB — GLUCOSE, CAPILLARY: GLUCOSE-CAPILLARY: 78 mg/dL (ref 65–99)

## 2015-09-05 MED ORDER — OXYTOCIN 40 UNITS IN LACTATED RINGERS INFUSION - SIMPLE MED
INTRAVENOUS | Status: AC
  Administered 2015-09-05: 1 m[IU]/min via INTRAVENOUS
  Filled 2015-09-05: qty 1000

## 2015-09-05 MED ORDER — LIDOCAINE HCL (PF) 1 % IJ SOLN
INTRAMUSCULAR | Status: AC
  Administered 2015-09-05: 1 mL via INTRADERMAL
  Filled 2015-09-05: qty 30

## 2015-09-05 MED ORDER — BUPIVACAINE HCL (PF) 0.25 % IJ SOLN
INTRAMUSCULAR | Status: DC | PRN
  Administered 2015-09-05: 5 mL via EPIDURAL

## 2015-09-05 MED ORDER — OXYTOCIN BOLUS FROM INFUSION: 500.0000 mL | INTRAVENOUS | Status: DC

## 2015-09-05 MED ORDER — AMMONIA AROMATIC IN INHA
RESPIRATORY_TRACT | Status: AC
  Filled 2015-09-05: qty 10

## 2015-09-05 MED ORDER — LIDOCAINE HCL (PF) 1 % IJ SOLN: 30.0000 mL | INTRAMUSCULAR | Status: DC | PRN

## 2015-09-05 MED ORDER — LIDOCAINE-EPINEPHRINE (PF) 1.5 %-1:200000 IJ SOLN
INTRAMUSCULAR | Status: DC | PRN
  Administered 2015-09-05: 3 mL via EPIDURAL

## 2015-09-05 MED ORDER — OXYTOCIN 10 UNIT/ML IJ SOLN
INTRAMUSCULAR | Status: AC
  Filled 2015-09-05: qty 2

## 2015-09-05 MED ORDER — FENTANYL 2.5 MCG/ML W/ROPIVACAINE 0.2% IN NS 100 ML EPIDURAL INFUSION (ARMC-ANES)
EPIDURAL | Status: AC
  Administered 2015-09-05: 10 mL/h via EPIDURAL
  Filled 2015-09-05: qty 100

## 2015-09-05 MED ORDER — LACTATED RINGERS IV SOLN: 500.0000 mL | INTRAVENOUS | Status: DC | PRN

## 2015-09-05 MED ORDER — OXYTOCIN 40 UNITS IN LACTATED RINGERS INFUSION - SIMPLE MED: 2.5000 [IU]/h | INTRAVENOUS | Status: DC

## 2015-09-05 MED ORDER — OXYTOCIN 40 UNITS IN LACTATED RINGERS INFUSION - SIMPLE MED
1.0000 m[IU]/min | INTRAVENOUS | Status: DC
  Administered 2015-09-05: 13 m[IU]/min via INTRAVENOUS
  Administered 2015-09-05: 11 m[IU]/min via INTRAVENOUS
  Administered 2015-09-05: 3 m[IU]/min via INTRAVENOUS
  Administered 2015-09-05: 17 m[IU]/min via INTRAVENOUS
  Administered 2015-09-05: 15 m[IU]/min via INTRAVENOUS
  Administered 2015-09-05: 1 m[IU]/min via INTRAVENOUS
  Administered 2015-09-05: 7 m[IU]/min via INTRAVENOUS
  Administered 2015-09-05: 9 m[IU]/min via INTRAVENOUS
  Administered 2015-09-05: 5 m[IU]/min via INTRAVENOUS

## 2015-09-05 MED ORDER — LACTATED RINGERS IV SOLN: INTRAVENOUS | Status: DC

## 2015-09-05 MED ORDER — MISOPROSTOL 200 MCG PO TABS
ORAL_TABLET | ORAL | Status: AC
  Filled 2015-09-05: qty 4

## 2015-09-05 MED ORDER — TERBUTALINE SULFATE 1 MG/ML IJ SOLN: 0.2500 mg | Freq: Once | INTRAMUSCULAR | Status: DC | PRN

## 2015-09-05 NOTE — H&P (Signed)
Obstetric History and Physical  Lauren Davis is a 35 y.o. G2P0 with IUP at [redacted]w[redacted]d presenting with planned IOL. Patient states she has been having  irregular, every 6-9 minutes contractions, none vaginal bleeding, intact membranes, with active fetal movement.    Prenatal Course Source of Care: [facility]  Pregnancy complications or risks:GDM, Oligohydramnious  Prenatal labs and studies: ABO, Rh: A/Positive/-- (09/13 1411) Antibody: Negative (09/13 1411) Rubella: 2.36 (09/13 1411) RPR: Non Reactive (09/13 1411)  HBsAg: Negative (09/13 1411)  HIV: Non Reactive (09/13 1411)  EAV:[id number] (03/30 1130) 1 hr Glucola  elevated Genetic screening normal Anatomy US normal  Past Medical History  Diagnosis Date  . Vaginal Pap smear, abnormal   . HPV in female   . Fatigue   . Constipation   . Gestational diabetes     Past Surgical History  Procedure Laterality Date  . Tonsillectomy  2004    OB History  Gravida Para Term Preterm AB SAB TAB Ectopic Multiple Living  2         1    # Outcome Date GA Lbr Len/2nd Weight Sex Delivery Anes PTL Lv  2 Current           1 Gravida 2010    F Vag-Spont   Y      Social History   Social History  . Marital Status: Married    Spouse Name: N/A  . Number of Children: N/A  . Years of Education: N/A   Social History Main Topics  . Smoking status: Never Smoker   . Smokeless tobacco: Never Used  . Alcohol Use: No  . Drug Use: No  . Sexual Activity: Yes    Birth Control/ Protection: None   Other Topics Concern  . Not on file   Social History Narrative    Family History  Problem Relation Age of Onset  . Diabetes Mother   . Heart disease Maternal Grandmother   . Diabetes Maternal Grandmother   . Heart disease Paternal Grandmother   . Heart disease Paternal Grandfather   . Diabetes Maternal Grandfather     Prescriptions prior to admission  Medication Sig Dispense Refill Last Dose  . acyclovir (ZOVIRAX) 400 MG tablet Take 400 mg by  mouth daily.   Taking  . docusate sodium (COLACE) 100 MG capsule Take 100 mg by mouth 2 (two) times daily as needed. Reported on 08/26/2015   Taking  . Prenatal Vit-Fe Fumarate-FA (PRENATAL MULTIVITAMIN) TABS tablet Take 1 tablet by mouth daily at 12 noon.   Taking    Allergies  Allergen Reactions  . Sulfa Antibiotics Rash    Review of Systems: Negative except for what is mentioned in HPI. Denies feeling pain with contractions.  Physical Exam: BP 111/74 mmHg  Pulse 106  LMP 12/04/2014 GENERAL: Well-developed, well-nourished female in no acute distress.  LUNGS: Clear to auscultation bilaterally.  HEART: Regular rate and rhythm. ABDOMEN: Soft, nontender, nondistended, gravid. EXTREMITIES: Nontender, no edema, 2+ distal pulses. Cervical Exam:  3/50/-2 FHT:  Baseline rate 125 bpm   Variability moderate  Accelerations present   Decelerations none Contractions: Every 2-3 mins on 3 mu/min of pitocin   Pertinent Labs/Studies:   No results found for this or any previous visit (from the past 24 hour(s)).  Assessment : Lauren Davis is a 35 y.o. G2P0 at [redacted]w[redacted]d being admitted for labor.  Plan: Labor: Expectant management.  Induction/Augmentation as needed, per protocol FWB: Reassuring fetal heart tracing.  GBS negative Delivery plan: Hopeful  for vaginal delivery  Dakarai Mcglocklin Aura CampsShambley, CNM [facility], [facility]

## 2015-09-05 NOTE — Progress Notes (Signed)
Flossie Dibbleina L Frisinger is a [age] y.o. G2P0 at 3437w2d by LMP admitted for induction of labor due to Gestational diabetes.  Subjective: Rates pain a 3-4 on pain scale, feeling some in low back  Objective: BP 113/66 mmHg  Pulse 90  Temp(Src) 97.9 F (36.6 C) (Oral)  Ht 5\' 9"  (1.753 m)  Wt 175 lb (79.379 kg)  BMI 25.83 kg/m2  LMP 12/04/2014   Total I/O In: 1025 [I.V.:1025] Out: -   FHT:  FHR: 125 bpm, variability: moderate,  accelerations:  Present,  decelerations:  Absent UC:   irregular, every 2-3 minutes on 317mu/min pitocin SVE:   Dilation: 4 Effacement (%): 60 Station: -1 Exam by:: MS CNM, AROM with small amount clear fluid  Labs: Lab Results  Component Value Date   WBC 10.9 09/05/2015   HGB 12.6 09/05/2015   HCT 37.2 09/05/2015   MCV 90.1 09/05/2015   PLT 232 09/05/2015    Assessment / Plan: Induction of labor due to gestational diabetes,  progressing well on pitocin  Labor: Progressing normally Preeclampsia:  labs stable Fetal Wellbeing:  Category I Pain Control:  Labor support without medications I/D:  n/a Anticipated MOD:  NSVD  Melody Suzan Nailer Shambley, CNM 09/05/2015, 6:23 PM

## 2015-09-05 NOTE — Anesthesia Preprocedure Evaluation (Signed)
Anesthesia Evaluation  Patient identified by MRN, date of birth, ID band Patient awake    Reviewed: Allergy & Precautions, H&P , NPO status , Patient's Chart, lab work & pertinent test results  History of Anesthesia Complications Negative for: history of anesthetic complications  Airway Mallampati: III  TM Distance: >3 FB Neck ROM: full    Dental no notable dental hx.    Pulmonary neg shortness of breath,    Pulmonary exam normal breath sounds clear to auscultation       Cardiovascular Exercise Tolerance: Good (-) angina(-) Past MI and (-) DOE negative cardio ROS Normal cardiovascular exam Rhythm:regular Rate:Normal     Neuro/Psych    GI/Hepatic negative GI ROS,   Endo/Other  diabetes  Renal/GU   negative genitourinary   Musculoskeletal   Abdominal   Peds  Hematology negative hematology ROS (+)   Anesthesia Other Findings Past Medical History:   Vaginal Pap smear, abnormal                                  HPV in female                                                Fatigue                                                      Constipation                                                 Gestational diabetes                                        Past Surgical History:   TONSILLECTOMY                                    2004        BMI    Body Mass Index   25.83 kg/m 2      Reproductive/Obstetrics (+) Pregnancy                             Anesthesia Physical Anesthesia Plan  ASA: III  Anesthesia Plan: Epidural   Post-op Pain Management:    Induction:   Airway Management Planned:   Additional Equipment:   Intra-op Plan:   Post-operative Plan:   Informed Consent: I have reviewed the patients History and Physical, chart, labs and discussed the procedure including the risks, benefits and alternatives for the proposed anesthesia with the patient or authorized  representative who has indicated his/her understanding and acceptance.     Plan Discussed with: Anesthesiologist  Anesthesia Plan Comments:         Anesthesia Quick Evaluation

## 2015-09-05 NOTE — Anesthesia Procedure Notes (Signed)
Epidural Patient location during procedure: OB Start time: 09/05/2015 7:26 PM End time: 09/05/2015 7:36 PM  Staffing Anesthesiologist: Margorie JohnPISCITELLO, Remus Hagedorn K Performed by: anesthesiologist   Preanesthetic Checklist Completed: patient identified, site marked, surgical consent, pre-op evaluation, timeout performed, IV checked, risks and benefits discussed and monitors and equipment checked  Epidural Patient position: sitting Prep: Betadine Patient monitoring: heart rate, continuous pulse ox and blood pressure Approach: midline Location: L3-L4 Injection technique: LOR saline  Needle:  Needle type: Tuohy  Needle gauge: 17 G Needle length: 9 cm and 9 Needle insertion depth: 9 cm Catheter type: closed end flexible Catheter size: 19 Gauge Catheter at skin depth: 14 cm Test dose: negative and 1.5% lidocaine with Epi 1:200 K  Assessment Sensory level: T10 Events: blood not aspirated, injection not painful, no injection resistance, negative IV test and paresthesia  Additional Notes Spaces very closed 2/2 positioning.  Paresthesia with catheter placement that resolved with catheter retraction.  Patient tolerated the insertion well without immediate complications.Reason for block:procedure for pain

## 2015-09-06 LAB — RPR: RPR Ser Ql: NONREACTIVE

## 2015-09-06 LAB — CBC
HEMATOCRIT: 36.6 % (ref 35.0–47.0)
HEMOGLOBIN: 12.2 g/dL (ref 12.0–16.0)
MCH: 30.1 pg (ref 26.0–34.0)
MCHC: 33.4 g/dL (ref 32.0–36.0)
MCV: 89.9 fL (ref 80.0–100.0)
Platelets: 227 10*3/uL (ref 150–440)
RBC: 4.07 MIL/uL (ref 3.80–5.20)
RDW: 14 % (ref 11.5–14.5)
WBC: 15.4 10*3/uL — ABNORMAL HIGH (ref 3.6–11.0)

## 2015-09-06 MED ORDER — ACETAMINOPHEN 325 MG PO TABS: 650.0000 mg | ORAL_TABLET | ORAL | Status: DC | PRN

## 2015-09-06 MED ORDER — DIPHENHYDRAMINE HCL 25 MG PO CAPS: 25.0000 mg | ORAL_CAPSULE | Freq: Four times a day (QID) | ORAL | Status: DC | PRN

## 2015-09-06 MED ORDER — BENZOCAINE-MENTHOL 20-0.5 % EX AERO
1.0000 "application " | INHALATION_SPRAY | CUTANEOUS | Status: DC | PRN
  Administered 2015-09-06: 1 via TOPICAL
  Filled 2015-09-06: qty 56

## 2015-09-06 MED ORDER — NALBUPHINE HCL 10 MG/ML IJ SOLN: 5.0000 mg | Freq: Once | INTRAMUSCULAR | Status: DC | PRN

## 2015-09-06 MED ORDER — NALBUPHINE HCL 10 MG/ML IJ SOLN: 5.0000 mg | INTRAMUSCULAR | Status: DC | PRN

## 2015-09-06 MED ORDER — DIPHENHYDRAMINE HCL 25 MG PO CAPS
25.0000 mg | ORAL_CAPSULE | ORAL | Status: DC | PRN
Start: 2015-09-06 — End: 2015-09-06

## 2015-09-06 MED ORDER — COCONUT OIL OIL
1.0000 "application " | TOPICAL_OIL | Status: DC | PRN
  Filled 2015-09-06: qty 120

## 2015-09-06 MED ORDER — DIPHENHYDRAMINE HCL 50 MG/ML IJ SOLN: 12.5000 mg | INTRAMUSCULAR | Status: DC | PRN

## 2015-09-06 MED ORDER — ACYCLOVIR 200 MG PO CAPS
400.0000 mg | ORAL_CAPSULE | Freq: Every day | ORAL | Status: DC
  Filled 2015-09-06 (×2): qty 2

## 2015-09-06 MED ORDER — OXYCODONE HCL 5 MG PO TABS: 5.0000 mg | ORAL_TABLET | ORAL | Status: DC | PRN

## 2015-09-06 MED ORDER — IBUPROFEN 600 MG PO TABS
600.0000 mg | ORAL_TABLET | Freq: Four times a day (QID) | ORAL | Status: DC
  Administered 2015-09-06 – 2015-09-07 (×5): 600 mg via ORAL
  Filled 2015-09-06 (×5): qty 1

## 2015-09-06 MED ORDER — ONDANSETRON HCL 4 MG/2ML IJ SOLN: 4.0000 mg | Freq: Three times a day (TID) | INTRAMUSCULAR | Status: DC | PRN

## 2015-09-06 MED ORDER — FENTANYL 2.5 MCG/ML W/ROPIVACAINE 0.2% IN NS 100 ML EPIDURAL INFUSION (ARMC-ANES): 10.0000 mL/h | EPIDURAL | Status: DC

## 2015-09-06 MED ORDER — ONDANSETRON HCL 4 MG/2ML IJ SOLN: 4.0000 mg | INTRAMUSCULAR | Status: DC | PRN

## 2015-09-06 MED ORDER — PRENATAL MULTIVITAMIN CH: 1.0000 | ORAL_TABLET | Freq: Every day | ORAL | Status: DC

## 2015-09-06 MED ORDER — WITCH HAZEL-GLYCERIN EX PADS: 1.0000 "application " | MEDICATED_PAD | CUTANEOUS | Status: DC | PRN

## 2015-09-06 MED ORDER — OXYCODONE HCL 5 MG PO TABS: 10.0000 mg | ORAL_TABLET | ORAL | Status: DC | PRN

## 2015-09-06 MED ORDER — DOCUSATE SODIUM 100 MG PO CAPS
100.0000 mg | ORAL_CAPSULE | Freq: Two times a day (BID) | ORAL | Status: DC
  Administered 2015-09-06 – 2015-09-07 (×3): 100 mg via ORAL
  Filled 2015-09-06 (×3): qty 1

## 2015-09-06 MED ORDER — ONDANSETRON HCL 4 MG PO TABS: 4.0000 mg | ORAL_TABLET | ORAL | Status: DC | PRN

## 2015-09-06 MED ORDER — NALOXONE HCL 2 MG/2ML IJ SOSY: 1.0000 ug/kg/h | PREFILLED_SYRINGE | INTRAVENOUS | Status: DC | PRN

## 2015-09-06 MED ORDER — SIMETHICONE 80 MG PO CHEW: 80.0000 mg | CHEWABLE_TABLET | ORAL | Status: DC | PRN

## 2015-09-06 MED ORDER — SODIUM CHLORIDE 0.9% FLUSH: 3.0000 mL | INTRAVENOUS | Status: DC | PRN

## 2015-09-06 MED ORDER — DIBUCAINE 1 % RE OINT: 1.0000 "application " | TOPICAL_OINTMENT | RECTAL | Status: DC | PRN

## 2015-09-06 MED ORDER — NALOXONE HCL 0.4 MG/ML IJ SOLN: 0.4000 mg | INTRAMUSCULAR | Status: DC | PRN

## 2015-09-06 MED ORDER — ZOLPIDEM TARTRATE 5 MG PO TABS: 5.0000 mg | ORAL_TABLET | Freq: Every evening | ORAL | Status: DC | PRN

## 2015-09-06 NOTE — Progress Notes (Signed)
Post Partum Day 1 Subjective: no complaints, up ad lib and voiding  Objective: Blood pressure 117/59, pulse 83, temperature 97.6 F (36.4 C), temperature source Oral, resp. rate 18, height 5\' 9"  (1.753 m), weight 175 lb (79.379 kg), last menstrual period 12/08/2014, SpO2 99 %, unknown if currently breastfeeding.  Physical Exam:  General: alert, cooperative, appears stated age and fatigued Lochia: appropriate Uterine Fundus: firm Incision: NA DVT Evaluation: No evidence of DVT seen on physical exam.   Recent Labs  09/05/15 0945 09/06/15 0601  HGB 12.6 12.2  HCT 37.2 36.6    Assessment/Plan: Plan for discharge tomorrow and Breastfeeding Infant feeding only Breast;     LOS: 1 day   [employer] MicrosystemsMelody N Demitrios Davis, CNM 09/06/2015, 1:37 PM

## 2015-09-06 NOTE — Anesthesia Postprocedure Evaluation (Signed)
Anesthesia Post Note  Patient: Lauren Davis Dials  Procedure(s) Performed: * No procedures listed *  Patient location during evaluation: Mother Baby Anesthesia Type: Epidural Level of consciousness: awake and alert Pain management: pain level controlled Vital Signs Assessment: post-procedure vital signs reviewed and stable Respiratory status: spontaneous breathing, nonlabored ventilation and respiratory function stable Cardiovascular status: stable Postop Assessment: no headache, no backache and epidural receding Anesthetic complications: no    Last Vitals:  Filed Vitals:   09/05/15 2246 09/06/15 0247  BP: 129/94 125/70  Pulse: 51 77  Temp:  37 C  Resp:  17    Last Pain:  Filed Vitals:   09/06/15 0247  PainSc: 0-No pain                 Starling Mannsurtis,  Kirstie Larsen A

## 2015-09-07 ENCOUNTER — Other Ambulatory Visit: Payer: Self-pay | Admitting: Obstetrics and Gynecology

## 2015-09-07 DIAGNOSIS — Z9889 Other specified postprocedural states: Secondary | ICD-10-CM | POA: Diagnosis not present

## 2015-09-07 DIAGNOSIS — Z8249 Family history of ischemic heart disease and other diseases of the circulatory system: Secondary | ICD-10-CM | POA: Diagnosis not present

## 2015-09-07 DIAGNOSIS — Z3A39 39 weeks gestation of pregnancy: Secondary | ICD-10-CM | POA: Diagnosis not present

## 2015-09-07 DIAGNOSIS — O4103X0 Oligohydramnios, third trimester, not applicable or unspecified: Secondary | ICD-10-CM | POA: Diagnosis not present

## 2015-09-07 DIAGNOSIS — Z79899 Other long term (current) drug therapy: Secondary | ICD-10-CM | POA: Diagnosis not present

## 2015-09-07 DIAGNOSIS — O24429 Gestational diabetes mellitus in childbirth, unspecified control: Secondary | ICD-10-CM | POA: Diagnosis not present

## 2015-09-07 DIAGNOSIS — Z882 Allergy status to sulfonamides status: Secondary | ICD-10-CM | POA: Diagnosis not present

## 2015-09-07 DIAGNOSIS — Z833 Family history of diabetes mellitus: Secondary | ICD-10-CM | POA: Diagnosis not present

## 2015-09-07 MED ORDER — VITAMIN D3 125 MCG (5000 UT) PO CAPS: 1.0000 | ORAL_CAPSULE | Freq: Every day | ORAL | Status: DC

## 2015-09-07 MED ORDER — IBUPROFEN 600 MG PO TABS: 600.0000 mg | ORAL_TABLET | Freq: Four times a day (QID) | ORAL | Status: DC

## 2015-09-07 MED ORDER — NORETHINDRONE 0.35 MG PO TABS: 1.0000 | ORAL_TABLET | Freq: Every day | ORAL | Status: DC

## 2015-09-07 MED ORDER — DOCUSATE SODIUM 100 MG PO CAPS: 100.0000 mg | ORAL_CAPSULE | Freq: Two times a day (BID) | ORAL | Status: DC

## 2015-09-07 NOTE — Discharge Summary (Signed)
Obstetric Discharge Summary Reason for Admission: induction of labor Prenatal Procedures: NST and ultrasound Intrapartum Procedures: spontaneous vaginal delivery and 2nd degree laceration Postpartum Procedures: none Complications-Operative and Postpartum: 2nd degree perineal laceration HEMOGLOBIN  Date Value Ref Range Status  09/06/2015 12.2 12.0 - 16.0 g/dL Final   HCT  Date Value Ref Range Status  09/06/2015 36.6 35.0 - 47.0 % Final   HEMATOCRIT  Date Value Ref Range Status  06/22/2015 36.4 34.0 - 46.6 % Final    Physical Exam:  General: alert, cooperative, appears stated age, fatigued and pale Lochia: appropriate Uterine Fundus: firm Incision: NA DVT Evaluation: No evidence of DVT seen on physical exam. Negative Homan's sign.  Discharge Diagnoses: Term Pregnancy-delivered and GDM  Discharge Information: Date: 09/07/2015 Activity: pelvic rest Diet: routine Medications: PNV, Ibuprofen, Colace and Camila & vit D3 Condition: stable Instructions: refer to practice specific booklet Discharge to: home   Newborn Data: Live born female "Collins" Birth Weight:  7#12oz APGAR: 8, 9  Home with mother.  Melody [employer] Shambley, CNM 09/07/2015, 7:57 AM

## 2015-09-12 ENCOUNTER — Encounter: Payer: Self-pay | Admitting: Obstetrics and Gynecology

## 2015-09-16 ENCOUNTER — Other Ambulatory Visit: Payer: Self-pay | Admitting: *Deleted

## 2015-09-16 ENCOUNTER — Telehealth: Payer: Self-pay

## 2015-09-16 MED ORDER — DICLOXACILLIN SODIUM 500 MG PO CAPS: 500.0000 mg | ORAL_CAPSULE | Freq: Four times a day (QID) | ORAL | Status: DC

## 2015-09-16 MED ORDER — ACYCLOVIR 400 MG PO TABS: 400.0000 mg | ORAL_TABLET | Freq: Two times a day (BID) | ORAL | Status: DC

## 2015-09-16 MED ORDER — FLUCONAZOLE 150 MG PO TABS: 150.0000 mg | ORAL_TABLET | Freq: Once | ORAL | Status: DC

## 2015-09-16 NOTE — Telephone Encounter (Signed)
Pt called needing refill on her acyclovir-done

## 2015-09-16 NOTE — Telephone Encounter (Signed)
Pt visits office with c/o left breast reddness, warm to area and hardness at the reddened site. Denies any fever. Dr. Valentino Saxonherry exams pt and orders ATB. Also pt would like Diflucan sent in also.

## 2015-10-18 ENCOUNTER — Ambulatory Visit (INDEPENDENT_AMBULATORY_CARE_PROVIDER_SITE_OTHER): Payer: 59 | Admitting: Obstetrics and Gynecology

## 2015-10-18 ENCOUNTER — Encounter: Payer: Self-pay | Admitting: Obstetrics and Gynecology

## 2015-10-18 MED ORDER — NORETHIN-ETH ESTRAD-FE BIPHAS 1 MG-10 MCG / 10 MCG PO TABS: 1.0000 | ORAL_TABLET | Freq: Every day | ORAL | Status: DC

## 2015-10-18 NOTE — Patient Instructions (Signed)
  Place postpartum visit patient instructions here.  

## 2015-10-18 NOTE — Progress Notes (Signed)
  Subjective:     Lauren Davis is a 35 y.o. female who presents for a postpartum visit. She is 6 weeks postpartum following a spontaneous vaginal delivery. I have fully reviewed the prenatal and intrapartum course. The delivery was at 39 gestational weeks. Outcome: spontaneous vaginal delivery. Anesthesia: epidural. Postpartum course has been uncomplicated. Baby's course has been complicated with reflux. Baby is feeding by formula. Bleeding no bleeding. Bowel function is normal. Bladder function is normal. Patient is not sexually active. Contraception method is OCP (estrogen/progesterone). Postpartum depression screening: negative.  The following portions of the patient's history were reviewed and updated as appropriate: allergies, current medications, past family history, past medical history, past social history, past surgical history and problem list.  Review of Systems A comprehensive review of systems was negative.   Objective:    BP 112/72 mmHg  Pulse 98  Ht 5' 8.5" (1.74 m)  Wt 154 lb 12.8 oz (70.217 kg)  BMI 23.19 kg/m2  LMP 10/14/2015  Breastfeeding? No  General:  alert, cooperative and appears stated age   Breasts:  inspection negative, no nipple discharge or bleeding, no masses or nodularity palpable  Lungs: clear to auscultation bilaterally  Heart:  regular rate and rhythm, S1, S2 normal, no murmur, click, rub or gallop  Abdomen: soft, non-tender; bowel sounds normal; no masses,  no organomegaly   Vulva:  normal  Vagina: normal vagina, no discharge, exudate, lesion, or erythema  Cervix:  multiparous appearance  Corpus: normal size, contour, position, consistency, mobility, non-tender  Adnexa:  no mass, fullness, tenderness  Rectal Exam: Not performed.        Assessment:     6 weeks postpartum exam. Pap smear not done at today's visit.   Plan:    1. Contraception: OCP (estrogen/progesterone) 3. Follow up in: 4 months or as needed.

## 2015-10-19 ENCOUNTER — Encounter: Payer: Self-pay | Admitting: Physician Assistant

## 2015-10-19 ENCOUNTER — Ambulatory Visit: Payer: Self-pay | Admitting: Physician Assistant

## 2015-10-19 VITALS — BP 120/80 | HR 80 | Temp 98.7°F

## 2015-10-19 DIAGNOSIS — R21 Rash and other nonspecific skin eruption: Secondary | ICD-10-CM

## 2015-10-19 MED ORDER — DEXAMETHASONE SODIUM PHOSPHATE 10 MG/ML IJ SOLN
10.0000 mg | Freq: Once | INTRAMUSCULAR | Status: AC
  Administered 2015-10-19: 10 mg via INTRAMUSCULAR

## 2015-10-19 NOTE — Progress Notes (Signed)
S: c/o rash on arms and legs, started last night, is 6 weeks post partum, is a little itchy , denies tick bite, no fever/chills  O: vitals wnl, skin with small bumps on arms and legs b/l, none on abd/trunk area, no pustules or drainage, no bruising  A: rash  P: decadron, if worsening or if get not better return to clinic

## 2016-02-17 ENCOUNTER — Encounter: Payer: 59 | Admitting: Obstetrics and Gynecology

## 2016-02-22 ENCOUNTER — Ambulatory Visit: Payer: 59 | Admitting: Obstetrics and Gynecology

## 2016-03-02 ENCOUNTER — Encounter: Payer: 59 | Admitting: Obstetrics and Gynecology

## 2016-03-12 ENCOUNTER — Ambulatory Visit: Payer: Self-pay | Admitting: Family

## 2016-03-22 ENCOUNTER — Encounter: Payer: Self-pay | Admitting: Obstetrics and Gynecology

## 2016-03-22 ENCOUNTER — Ambulatory Visit (INDEPENDENT_AMBULATORY_CARE_PROVIDER_SITE_OTHER): Payer: 59 | Admitting: Obstetrics and Gynecology

## 2016-03-22 VITALS — BP 116/72 | HR 81 | Ht 68.0 in | Wt 150.3 lb

## 2016-03-22 DIAGNOSIS — N921 Excessive and frequent menstruation with irregular cycle: Secondary | ICD-10-CM | POA: Diagnosis not present

## 2016-03-22 DIAGNOSIS — Z01419 Encounter for gynecological examination (general) (routine) without abnormal findings: Secondary | ICD-10-CM

## 2016-03-22 LAB — HM PAP SMEAR: HM PAP: NEGATIVE

## 2016-03-22 NOTE — Progress Notes (Signed)
Subjective:   Lauren Davis is a 35 y.o. 882P1002 Caucasian female here for a routine well-woman exam.  Patient's last menstrual period was 03/19/2016.    Current complaints: irregular periods on birth control, had 1 regular period after initiating however has been irregular since. PCP: Nigel BertholdLebaur (Arnette, NP)       does desire labs  Social History: Sexual: heterosexual Marital Status: married Living situation: with spouse Occupation: at [employer] Tobacco/alcohol: caffeine intake: uses caffeine Illicit drugs: no history of illicit drug use  The following portions of the patient's history were reviewed and updated as appropriate: allergies, current medications, past family history, past medical history, past social history, past surgical history and problem list.  Past Medical History Past Medical History:  Diagnosis Date  . Constipation   . Fatigue   . Gestational diabetes   . HPV in female   . Vaginal Pap smear, abnormal     Past Surgical History Past Surgical History:  Procedure Laterality Date  . TONSILLECTOMY  2004    Gynecologic History G2P1002  Patient's last menstrual period was 03/19/2016. Contraception: OCP (estrogen/progesterone) Last Pap: unsure. Results were: normal   Obstetric History OB History  Gravida Para Term Preterm AB Living  2 1 1     2   SAB TAB Ectopic Multiple Live Births        0 2    # Outcome Date GA Lbr Len/2nd Weight Sex Delivery Anes PTL Lv  2 Term 09/05/15 5029w2d / 00:16 7 lb 12 oz (3.515 kg) F Vag-Spont EPI  LIV  1 Gravida 2010    F Vag-Spont   LIV      Current Medications Current Outpatient Prescriptions on File Prior to Visit  Medication Sig Dispense Refill  . acyclovir (ZOVIRAX) 400 MG tablet Take 1 tablet (400 mg total) by mouth 2 (two) times daily. 60 tablet 4  . Norethindrone-Ethinyl Estradiol-Fe Biphas (LO LOESTRIN FE) 1 MG-10 MCG / 10 MCG tablet Take 1 tablet by mouth daily. 1 Package 11   No current facility-administered  medications on file prior to visit.     Review of Systems Patient denies any headaches, blurred vision, shortness of breath, chest pain, abdominal pain, problems with bowel movements, urination, or intercourse.  Objective:  BP 116/72   Pulse 81   Ht 5\' 8"  (1.727 m)   Wt 150 lb 4.8 oz (68.2 kg)   LMP 03/19/2016   Breastfeeding? No   BMI 22.85 kg/m  Physical Exam  General:  Well developed, well nourished, no acute distress. She is alert and oriented x3. Skin:  Warm and dry Neck:  Midline trachea, no thyromegaly or nodules Cardiovascular: Regular rate and rhythm, no murmur heard Lungs:  Effort normal, all lung fields clear to auscultation bilaterally Breasts:  No dominant palpable mass, retraction, or nipple discharge Abdomen:  Soft, non tender, no hepatosplenomegaly or masses Pelvic:  External genitalia is normal in appearance.  The vagina is normal in appearance. The cervix is bulbous, no CMT.  Thin prep pap is done with HR HPV cotesting. Uterus is felt to be normal size, shape, and contour.  No adnexal masses or tenderness noted. Rectal: Good sphincter tone, no polyps, or hemorrhoids felt.  Extremities:  No swelling or varicosities noted Psych:  She has a normal mood and affect  Assessment:   Healthy well-woman exam Flu and TDap UTD BTB on OCPs Plan:  Labs Pending PAP done and pending Patient wants to discontinue her birth control for a month and let  her cycle reset and then go back on the same birth control. She will use condoms during this time. F/U in 1 year for AE, or sooner if needed  Ayano Douthitt Suzan NailerN Oliviah Agostini, CNM

## 2016-03-22 NOTE — Patient Instructions (Signed)
Preventive Care for Adults, Female A healthy lifestyle and preventive care can promote health and wellness. Preventive health guidelines for women include the following key practices.  A routine yearly physical is a good way to check with your health care provider about your health and preventive screening. It is a chance to share any concerns and updates on your health and to receive a thorough exam.  Visit your dentist for a routine exam and preventive care every 6 months. Brush your teeth twice a day and floss once a day. Good oral hygiene prevents tooth decay and gum disease.  The frequency of eye exams is based on your age, health, family medical history, use of contact lenses, and other factors. Follow your health care provider's recommendations for frequency of eye exams.  Eat a healthy diet. Foods like vegetables, fruits, whole grains, low-fat dairy products, and lean protein foods contain the nutrients you need without too many calories. Decrease your intake of foods high in solid fats, added sugars, and salt. Eat the right amount of calories for you.Get information about a proper diet from your health care provider, if necessary.  Regular physical exercise is one of the most important things you can do for your health. Most adults should get at least 150 minutes of moderate-intensity exercise (any activity that increases your heart rate and causes you to sweat) each week. In addition, most adults need muscle-strengthening exercises on 2 or more days a week.  Maintain a healthy weight. The body mass index (BMI) is a screening tool to identify possible weight problems. It provides an estimate of body fat based on height and weight. Your health care provider can find your BMI and can help you achieve or maintain a healthy weight.For adults 20 years and older:  A BMI below 18.5 is considered underweight.  A BMI of 18.5 to 24.9 is normal.  A BMI of 25 to 29.9 is considered  overweight.  A BMI of 30 and above is considered obese.  Maintain normal blood lipids and cholesterol levels by exercising and minimizing your intake of saturated fat. Eat a balanced diet with plenty of fruit and vegetables. Blood tests for lipids and cholesterol should begin at age 64 and be repeated every 5 years. If your lipid or cholesterol levels are high, you are over 50, or you are at high risk for heart disease, you may need your cholesterol levels checked more frequently.Ongoing high lipid and cholesterol levels should be treated with medicines if diet and exercise are not working.  If you smoke, find out from your health care provider how to quit. If you do not use tobacco, do not start.  Lung cancer screening is recommended for adults aged 52-80 years who are at high risk for developing lung cancer because of a history of smoking. A yearly low-dose CT scan of the lungs is recommended for people who have at least a 30-pack-year history of smoking and are a current smoker or have quit within the past 15 years. A pack year of smoking is smoking an average of 1 pack of cigarettes a day for 1 year (for example: 1 pack a day for 30 years or 2 packs a day for 15 years). Yearly screening should continue until the smoker has stopped smoking for at least 15 years. Yearly screening should be stopped for people who develop a health problem that would prevent them from having lung cancer treatment.  If you are pregnant, do not drink alcohol. If you are  breastfeeding, be very cautious about drinking alcohol. If you are not pregnant and choose to drink alcohol, do not have more than 1 drink per day. One drink is considered to be 12 ounces (355 mL) of beer, 5 ounces (148 mL) of wine, or 1.5 ounces (44 mL) of liquor.  Avoid use of street drugs. Do not share needles with anyone. Ask for help if you need support or instructions about stopping the use of drugs.  High blood pressure causes heart disease and  increases the risk of stroke. Your blood pressure should be checked at least every 1 to 2 years. Ongoing high blood pressure should be treated with medicines if weight loss and exercise do not work.  If you are 25-78 years old, ask your health care provider if you should take aspirin to prevent strokes.  Diabetes screening is done by taking a blood sample to check your blood glucose level after you have not eaten for a certain period of time (fasting). If you are not overweight and you do not have risk factors for diabetes, you should be screened once every 3 years starting at age 86. If you are overweight or obese and you are 3-87 years of age, you should be screened for diabetes every year as part of your cardiovascular risk assessment.  Breast cancer screening is essential preventive care for women. You should practice "breast self-awareness." This means understanding the normal appearance and feel of your breasts and may include breast self-examination. Any changes detected, no matter how small, should be reported to a health care provider. Women in their 66s and 30s should have a clinical breast exam (CBE) by a health care provider as part of a regular health exam every 1 to 3 years. After age 43, women should have a CBE every year. Starting at age 37, women should consider having a mammogram (breast X-ray test) every year. Women who have a family history of breast cancer should talk to their health care provider about genetic screening. Women at a high risk of breast cancer should talk to their health care providers about having an MRI and a mammogram every year.  Breast cancer gene (BRCA)-related cancer risk assessment is recommended for women who have family members with BRCA-related cancers. BRCA-related cancers include breast, ovarian, tubal, and peritoneal cancers. Having family members with these cancers may be associated with an increased risk for harmful changes (mutations) in the breast  cancer genes BRCA1 and BRCA2. Results of the assessment will determine the need for genetic counseling and BRCA1 and BRCA2 testing.  Your health care provider may recommend that you be screened regularly for cancer of the pelvic organs (ovaries, uterus, and vagina). This screening involves a pelvic examination, including checking for microscopic changes to the surface of your cervix (Pap test). You may be encouraged to have this screening done every 3 years, beginning at age 78.  For women ages 79-65, health care providers may recommend pelvic exams and Pap testing every 3 years, or they may recommend the Pap and pelvic exam, combined with testing for human papilloma virus (HPV), every 5 years. Some types of HPV increase your risk of cervical cancer. Testing for HPV may also be done on women of any age with unclear Pap test results.  Other health care providers may not recommend any screening for nonpregnant women who are considered low risk for pelvic cancer and who do not have symptoms. Ask your health care provider if a screening pelvic exam is right for  you.  If you have had past treatment for cervical cancer or a condition that could lead to cancer, you need Pap tests and screening for cancer for at least 20 years after your treatment. If Pap tests have been discontinued, your risk factors (such as having a new sexual partner) need to be reassessed to determine if screening should resume. Some women have medical problems that increase the chance of getting cervical cancer. In these cases, your health care provider may recommend more frequent screening and Pap tests.  Colorectal cancer can be detected and often prevented. Most routine colorectal cancer screening begins at the age of 50 years and continues through age 75 years. However, your health care provider may recommend screening at an earlier age if you have risk factors for colon cancer. On a yearly basis, your health care provider may provide  home test kits to check for hidden blood in the stool. Use of a small camera at the end of a tube, to directly examine the colon (sigmoidoscopy or colonoscopy), can detect the earliest forms of colorectal cancer. Talk to your health care provider about this at age 50, when routine screening begins. Direct exam of the colon should be repeated every 5-10 years through age 75 years, unless early forms of precancerous polyps or small growths are found.  People who are at an increased risk for hepatitis B should be screened for this virus. You are considered at high risk for hepatitis B if:  You were born in a country where hepatitis B occurs often. Talk with your health care provider about which countries are considered high risk.  Your parents were born in a high-risk country and you have not received a shot to protect against hepatitis B (hepatitis B vaccine).  You have HIV or AIDS.  You use needles to inject street drugs.  You live with, or have sex with, someone who has hepatitis B.  You get hemodialysis treatment.  You take certain medicines for conditions like cancer, organ transplantation, and autoimmune conditions.  Hepatitis C blood testing is recommended for all people born from 1945 through 1965 and any individual with known risks for hepatitis C.  Practice safe sex. Use condoms and avoid high-risk sexual practices to reduce the spread of sexually transmitted infections (STIs). STIs include gonorrhea, chlamydia, syphilis, trichomonas, herpes, HPV, and human immunodeficiency virus (HIV). Herpes, HIV, and HPV are viral illnesses that have no cure. They can result in disability, cancer, and death.  You should be screened for sexually transmitted illnesses (STIs) including gonorrhea and chlamydia if:  You are sexually active and are younger than 24 years.  You are older than 24 years and your health care provider tells you that you are at risk for this type of infection.  Your sexual  activity has changed since you were last screened and you are at an increased risk for chlamydia or gonorrhea. Ask your health care provider if you are at risk.  If you are at risk of being infected with HIV, it is recommended that you take a prescription medicine daily to prevent HIV infection. This is called preexposure prophylaxis (PrEP). You are considered at risk if:  You are sexually active and do not regularly use condoms or know the HIV status of your partner(s).  You take drugs by injection.  You are sexually active with a partner who has HIV.  Talk with your health care provider about whether you are at high risk of being infected with HIV. If   you choose to begin PrEP, you should first be tested for HIV. You should then be tested every 3 months for as long as you are taking PrEP.  Osteoporosis is a disease in which the bones lose minerals and strength with aging. This can result in serious bone fractures or breaks. The risk of osteoporosis can be identified using a bone density scan. Women ages 1 years and over and women at risk for fractures or osteoporosis should discuss screening with their health care providers. Ask your health care provider whether you should take a calcium supplement or vitamin D to reduce the rate of osteoporosis.  Menopause can be associated with physical symptoms and risks. Hormone replacement therapy is available to decrease symptoms and risks. You should talk to your health care provider about whether hormone replacement therapy is right for you.  Use sunscreen. Apply sunscreen liberally and repeatedly throughout the day. You should seek shade when your shadow is shorter than you. Protect yourself by wearing long sleeves, pants, a wide-brimmed hat, and sunglasses year round, whenever you are outdoors.  Once a month, do a whole body skin exam, using a mirror to look at the skin on your back. Tell your health care provider of new moles, moles that have irregular  borders, moles that are larger than a pencil eraser, or moles that have changed in shape or color.  Stay current with required vaccines (immunizations).  Influenza vaccine. All adults should be immunized every year.  Tetanus, diphtheria, and acellular pertussis (Td, Tdap) vaccine. Pregnant women should receive 1 dose of Tdap vaccine during each pregnancy. The dose should be obtained regardless of the length of time since the last dose. Immunization is preferred during the 27th-36th week of gestation. An adult who has not previously received Tdap or who does not know her vaccine status should receive 1 dose of Tdap. This initial dose should be followed by tetanus and diphtheria toxoids (Td) booster doses every 10 years. Adults with an unknown or incomplete history of completing a 3-dose immunization series with Td-containing vaccines should begin or complete a primary immunization series including a Tdap dose. Adults should receive a Td booster every 10 years.  Varicella vaccine. An adult without evidence of immunity to varicella should receive 2 doses or a second dose if she has previously received 1 dose. Pregnant females who do not have evidence of immunity should receive the first dose after pregnancy. This first dose should be obtained before leaving the health care facility. The second dose should be obtained 4-8 weeks after the first dose.  Human papillomavirus (HPV) vaccine. Females aged 13-26 years who have not received the vaccine previously should obtain the 3-dose series. The vaccine is not recommended for use in pregnant females. However, pregnancy testing is not needed before receiving a dose. If a female is found to be pregnant after receiving a dose, no treatment is needed. In that case, the remaining doses should be delayed until after the pregnancy. Immunization is recommended for any person with an immunocompromised condition through the age of 24 years if she did not get any or all doses  earlier. During the 3-dose series, the second dose should be obtained 4-8 weeks after the first dose. The third dose should be obtained 24 weeks after the first dose and 16 weeks after the second dose.  Zoster vaccine. One dose is recommended for adults aged 97 years or older unless certain conditions are present.  Measles, mumps, and rubella (MMR) vaccine. Adults born  before 1957 generally are considered immune to measles and mumps. Adults born in [birth year] or later should have 1 or more doses of MMR vaccine unless there is a contraindication to the vaccine or there is laboratory evidence of immunity to each of the three diseases. A routine second dose of MMR vaccine should be obtained at least 28 days after the first dose for students attending postsecondary schools, health care workers, or international travelers. People who received inactivated measles vaccine or an unknown type of measles vaccine during 1963-1967 should receive 2 doses of MMR vaccine. People who received inactivated mumps vaccine or an unknown type of mumps vaccine before 1979 and are at high risk for mumps infection should consider immunization with 2 doses of MMR vaccine. For females of childbearing age, rubella immunity should be determined. If there is no evidence of immunity, females who are not pregnant should be vaccinated. If there is no evidence of immunity, females who are pregnant should delay immunization until after pregnancy. Unvaccinated health care workers born before 60 who lack laboratory evidence of measles, mumps, or rubella immunity or laboratory confirmation of disease should consider measles and mumps immunization with 2 doses of MMR vaccine or rubella immunization with 1 dose of MMR vaccine.  Pneumococcal 13-valent conjugate (PCV13) vaccine. When indicated, a person who is uncertain of his immunization history and has no record of immunization should receive the PCV13 vaccine. All adults 61 years of age and older  should receive this vaccine. An adult aged 92 years or older who has certain medical conditions and has not been previously immunized should receive 1 dose of PCV13 vaccine. This PCV13 should be followed with a dose of pneumococcal polysaccharide (PPSV23) vaccine. Adults who are at high risk for pneumococcal disease should obtain the PPSV23 vaccine at least 8 weeks after the dose of PCV13 vaccine. Adults older than 35 years of age who have normal immune system function should obtain the PPSV23 vaccine dose at least 1 year after the dose of PCV13 vaccine.  Pneumococcal polysaccharide (PPSV23) vaccine. When PCV13 is also indicated, PCV13 should be obtained first. All adults aged 2 years and older should be immunized. An adult younger than age 30 years who has certain medical conditions should be immunized. Any person who resides in a nursing home or long-term care facility should be immunized. An adult smoker should be immunized. People with an immunocompromised condition and certain other conditions should receive both PCV13 and PPSV23 vaccines. People with human immunodeficiency virus (HIV) infection should be immunized as soon as possible after diagnosis. Immunization during chemotherapy or radiation therapy should be avoided. Routine use of PPSV23 vaccine is not recommended for American Indians, [place] Natives, or people younger than 65 years unless there are medical conditions that require PPSV23 vaccine. When indicated, people who have unknown immunization and have no record of immunization should receive PPSV23 vaccine. One-time revaccination 5 years after the first dose of PPSV23 is recommended for people aged 19-64 years who have chronic kidney failure, nephrotic syndrome, asplenia, or immunocompromised conditions. People who received 1-2 doses of PPSV23 before age 44 years should receive another dose of PPSV23 vaccine at age 83 years or later if at least 5 years have passed since the previous dose. Doses  of PPSV23 are not needed for people immunized with PPSV23 at or after age 20 years.  Meningococcal vaccine. Adults with asplenia or persistent complement component deficiencies should receive 2 doses of quadrivalent meningococcal conjugate (MenACWY-D) vaccine. The doses should be obtained  at least 2 months apart. Microbiologists working with certain meningococcal bacteria, [place] recruits, people at risk during an outbreak, and people who travel to or live in countries with a high rate of meningitis should be immunized. A first-year college student up through age 28 years who is living in a residence hall should receive a dose if she did not receive a dose on or after her 16th birthday. Adults who have certain high-risk conditions should receive one or more doses of vaccine.  Hepatitis A vaccine. Adults who wish to be protected from this disease, have certain high-risk conditions, work with hepatitis A-infected animals, work in hepatitis A research labs, or travel to or work in countries with a high rate of hepatitis A should be immunized. Adults who were previously unvaccinated and who anticipate close contact with an international adoptee during the first 60 days after arrival in the [place] from a country with a high rate of hepatitis A should be immunized.  Hepatitis B vaccine. Adults who wish to be protected from this disease, have certain high-risk conditions, may be exposed to blood or other infectious body fluids, are household contacts or sex partners of hepatitis B positive people, are clients or workers in certain care facilities, or travel to or work in countries with a high rate of hepatitis B should be immunized.  Haemophilus influenzae type b (Hib) vaccine. A previously unvaccinated person with asplenia or sickle cell disease or having a scheduled splenectomy should receive 1 dose of Hib vaccine. Regardless of previous immunization, a recipient of a hematopoietic stem cell transplant  should receive a 3-dose series 6-12 months after her successful transplant. Hib vaccine is not recommended for adults with HIV infection. Preventive Services / Frequency Ages 71 to 87 years  Blood pressure check.** / Every 3-5 years.  Lipid and cholesterol check.** / Every 5 years beginning at age 1.  Clinical breast exam.** / Every 3 years for women in their 3s and 31s.  BRCA-related cancer risk assessment.** / For women who have family members with a BRCA-related cancer (breast, ovarian, tubal, or peritoneal cancers).  Pap test.** / Every 2 years from ages 50 through 86. Every 3 years starting at age 87 through age 7 or 75 with a history of 3 consecutive normal Pap tests.  HPV screening.** / Every 3 years from ages 59 through ages 35 to 6 with a history of 3 consecutive normal Pap tests.  Hepatitis C blood test.** / For any individual with known risks for hepatitis C.  Skin self-exam. / Monthly.  Influenza vaccine. / Every year.  Tetanus, diphtheria, and acellular pertussis (Tdap, Td) vaccine.** / Consult your health care provider. Pregnant women should receive 1 dose of Tdap vaccine during each pregnancy. 1 dose of Td every 10 years.  Varicella vaccine.** / Consult your health care provider. Pregnant females who do not have evidence of immunity should receive the first dose after pregnancy.  HPV vaccine. / 3 doses over 6 months, if 72 and younger. The vaccine is not recommended for use in pregnant females. However, pregnancy testing is not needed before receiving a dose.  Measles, mumps, rubella (MMR) vaccine.** / You need at least 1 dose of MMR if you were born in 1957 or later. You may also need a 2nd dose. For females of childbearing age, rubella immunity should be determined. If there is no evidence of immunity, females who are not pregnant should be vaccinated. If there is no evidence of immunity, females who are  pregnant should delay immunization until after  pregnancy.  Pneumococcal 13-valent conjugate (PCV13) vaccine.** / Consult your health care provider.  Pneumococcal polysaccharide (PPSV23) vaccine.** / 1 to 2 doses if you smoke cigarettes or if you have certain conditions.  Meningococcal vaccine.** / 1 dose if you are age 87 to 44 years and a Market researcher living in a residence hall, or have one of several medical conditions, you need to get vaccinated against meningococcal disease. You may also need additional booster doses.  Hepatitis A vaccine.** / Consult your health care provider.  Hepatitis B vaccine.** / Consult your health care provider.  Haemophilus influenzae type b (Hib) vaccine.** / Consult your health care provider. Ages 86 to 38 years  Blood pressure check.** / Every year.  Lipid and cholesterol check.** / Every 5 years beginning at age 49 years.  Lung cancer screening. / Every year if you are aged 71-80 years and have a 30-pack-year history of smoking and currently smoke or have quit within the past 15 years. Yearly screening is stopped once you have quit smoking for at least 15 years or develop a health problem that would prevent you from having lung cancer treatment.  Clinical breast exam.** / Every year after age 51 years.  BRCA-related cancer risk assessment.** / For women who have family members with a BRCA-related cancer (breast, ovarian, tubal, or peritoneal cancers).  Mammogram.** / Every year beginning at age 18 years and continuing for as long as you are in good health. Consult with your health care provider.  Pap test.** / Every 3 years starting at age 63 years through age 37 or 57 years with a history of 3 consecutive normal Pap tests.  HPV screening.** / Every 3 years from ages 41 years through ages 76 to 23 years with a history of 3 consecutive normal Pap tests.  Fecal occult blood test (FOBT) of stool. / Every year beginning at age 36 years and continuing until age 51 years. You may not need  to do this test if you get a colonoscopy every 10 years.  Flexible sigmoidoscopy or colonoscopy.** / Every 5 years for a flexible sigmoidoscopy or every 10 years for a colonoscopy beginning at age 36 years and continuing until age 35 years.  Hepatitis C blood test.** / For all people born from [birth year] through 1965 and any individual with known risks for hepatitis C.  Skin self-exam. / Monthly.  Influenza vaccine. / Every year.  Tetanus, diphtheria, and acellular pertussis (Tdap/Td) vaccine.** / Consult your health care provider. Pregnant women should receive 1 dose of Tdap vaccine during each pregnancy. 1 dose of Td every 10 years.  Varicella vaccine.** / Consult your health care provider. Pregnant females who do not have evidence of immunity should receive the first dose after pregnancy.  Zoster vaccine.** / 1 dose for adults aged 73 years or older.  Measles, mumps, rubella (MMR) vaccine.** / You need at least 1 dose of MMR if you were born in 1957 or later. You may also need a second dose. For females of childbearing age, rubella immunity should be determined. If there is no evidence of immunity, females who are not pregnant should be vaccinated. If there is no evidence of immunity, females who are pregnant should delay immunization until after pregnancy.  Pneumococcal 13-valent conjugate (PCV13) vaccine.** / Consult your health care provider.  Pneumococcal polysaccharide (PPSV23) vaccine.** / 1 to 2 doses if you smoke cigarettes or if you have certain conditions.  Meningococcal vaccine.** /  Consult your health care provider.  Hepatitis A vaccine.** / Consult your health care provider.  Hepatitis B vaccine.** / Consult your health care provider.  Haemophilus influenzae type b (Hib) vaccine.** / Consult your health care provider. Ages 80 years and over  Blood pressure check.** / Every year.  Lipid and cholesterol check.** / Every 5 years beginning at age 62 years.  Lung cancer  screening. / Every year if you are aged 32-80 years and have a 30-pack-year history of smoking and currently smoke or have quit within the past 15 years. Yearly screening is stopped once you have quit smoking for at least 15 years or develop a health problem that would prevent you from having lung cancer treatment.  Clinical breast exam.** / Every year after age 61 years.  BRCA-related cancer risk assessment.** / For women who have family members with a BRCA-related cancer (breast, ovarian, tubal, or peritoneal cancers).  Mammogram.** / Every year beginning at age 39 years and continuing for as long as you are in good health. Consult with your health care provider.  Pap test.** / Every 3 years starting at age 85 years through age 74 or 72 years with 3 consecutive normal Pap tests. Testing can be stopped between 65 and 70 years with 3 consecutive normal Pap tests and no abnormal Pap or HPV tests in the past 10 years.  HPV screening.** / Every 3 years from ages 55 years through ages 67 or 77 years with a history of 3 consecutive normal Pap tests. Testing can be stopped between 65 and 70 years with 3 consecutive normal Pap tests and no abnormal Pap or HPV tests in the past 10 years.  Fecal occult blood test (FOBT) of stool. / Every year beginning at age 81 years and continuing until age 22 years. You may not need to do this test if you get a colonoscopy every 10 years.  Flexible sigmoidoscopy or colonoscopy.** / Every 5 years for a flexible sigmoidoscopy or every 10 years for a colonoscopy beginning at age 67 years and continuing until age 22 years.  Hepatitis C blood test.** / For all people born from [birth year] through 1965 and any individual with known risks for hepatitis C.  Osteoporosis screening.** / A one-time screening for women ages 8 years and over and women at risk for fractures or osteoporosis.  Skin self-exam. / Monthly.  Influenza vaccine. / Every year.  Tetanus, diphtheria, and  acellular pertussis (Tdap/Td) vaccine.** / 1 dose of Td every 10 years.  Varicella vaccine.** / Consult your health care provider.  Zoster vaccine.** / 1 dose for adults aged 56 years or older.  Pneumococcal 13-valent conjugate (PCV13) vaccine.** / Consult your health care provider.  Pneumococcal polysaccharide (PPSV23) vaccine.** / 1 dose for all adults aged 15 years and older.  Meningococcal vaccine.** / Consult your health care provider.  Hepatitis A vaccine.** / Consult your health care provider.  Hepatitis B vaccine.** / Consult your health care provider.  Haemophilus influenzae type b (Hib) vaccine.** / Consult your health care provider. ** Family history and personal history of risk and conditions may change your health care provider's recommendations.   This information is not intended to replace advice given to you by your health care provider. Make sure you discuss any questions you have with your health care provider.   Document Released: 07/03/2001 Document Revised: 05/28/2014 Document Reviewed: 10/02/2010 Elsevier Interactive Patient Education [employer].

## 2016-03-23 LAB — COMPREHENSIVE METABOLIC PANEL
ALBUMIN: 4.1 g/dL (ref 3.5–5.5)
ALK PHOS: 49 IU/L (ref 39–117)
ALT: 12 IU/L (ref 0–32)
AST: 12 IU/L (ref 0–40)
Albumin/Globulin Ratio: 1.5 (ref 1.2–2.2)
BUN / CREAT RATIO: 9 (ref 9–23)
BUN: 8 mg/dL (ref 6–20)
Bilirubin Total: 0.4 mg/dL (ref 0.0–1.2)
CALCIUM: 9.3 mg/dL (ref 8.7–10.2)
CO2: 20 mmol/L (ref 18–29)
CREATININE: 0.88 mg/dL (ref 0.57–1.00)
Chloride: 102 mmol/L (ref 96–106)
GFR, EST AFRICAN AMERICAN: 98 mL/min/{1.73_m2} (ref >59)
GFR, EST NON AFRICAN AMERICAN: 85 mL/min/{1.73_m2} (ref >59)
GLOBULIN, TOTAL: 2.8 g/dL (ref 1.5–4.5)
GLUCOSE: 78 mg/dL (ref 65–99)
Potassium: 4.8 mmol/L (ref 3.5–5.2)
SODIUM: 139 mmol/L (ref 134–144)
TOTAL PROTEIN: 6.9 g/dL (ref 6.0–8.5)

## 2016-03-23 LAB — HEMOGLOBIN A1C
Est. average glucose Bld gHb Est-mCnc: 105 mg/dL
HEMOGLOBIN A1C: 5.3 % (ref 4.8–5.6)

## 2016-03-23 LAB — LIPID PANEL
CHOLESTEROL TOTAL: 184 mg/dL (ref 100–199)
Chol/HDL Ratio: 3.5 ratio units (ref 0.0–4.4)
HDL: 52 mg/dL (ref >39)
LDL Calculated: 112 mg/dL — ABNORMAL HIGH (ref 0–99)
Triglycerides: 100 mg/dL (ref 0–149)
VLDL CHOLESTEROL CAL: 20 mg/dL (ref 5–40)

## 2016-03-23 LAB — VITAMIN D 25 HYDROXY (VIT D DEFICIENCY, FRACTURES): Vit D, 25-Hydroxy: 41.6 ng/mL (ref 30.0–100.0)

## 2016-04-02 ENCOUNTER — Encounter: Payer: Self-pay | Admitting: Obstetrics and Gynecology

## 2016-05-28 DIAGNOSIS — H5213 Myopia, bilateral: Secondary | ICD-10-CM | POA: Diagnosis not present

## 2016-05-28 DIAGNOSIS — H52222 Regular astigmatism, left eye: Secondary | ICD-10-CM | POA: Diagnosis not present

## 2016-06-04 ENCOUNTER — Ambulatory Visit: Payer: Self-pay | Admitting: Family

## 2016-08-24 ENCOUNTER — Ambulatory Visit: Payer: Self-pay | Admitting: Physician Assistant

## 2016-10-09 ENCOUNTER — Other Ambulatory Visit: Payer: Self-pay | Admitting: *Deleted

## 2016-10-09 MED ORDER — TERCONAZOLE 0.4 % VA CREA: 1.0000 | TOPICAL_CREAM | Freq: Every day | VAGINAL | 1 refills | Status: DC

## 2016-10-09 MED ORDER — ACYCLOVIR 400 MG PO TABS: 400.0000 mg | ORAL_TABLET | Freq: Two times a day (BID) | ORAL | 4 refills | Status: DC

## 2017-03-28 ENCOUNTER — Ambulatory Visit (INDEPENDENT_AMBULATORY_CARE_PROVIDER_SITE_OTHER): Payer: 59 | Admitting: Obstetrics and Gynecology

## 2017-03-28 ENCOUNTER — Encounter: Payer: Self-pay | Admitting: Obstetrics and Gynecology

## 2017-03-28 VITALS — BP 120/80 | HR 84 | Ht 68.0 in | Wt 152.1 lb

## 2017-03-28 DIAGNOSIS — Z01419 Encounter for gynecological examination (general) (routine) without abnormal findings: Secondary | ICD-10-CM | POA: Diagnosis not present

## 2017-03-28 MED ORDER — NORETHIN-ETH ESTRAD-FE BIPHAS 1 MG-10 MCG / 10 MCG PO TABS: 1.0000 | ORAL_TABLET | Freq: Every day | ORAL | 11 refills | Status: DC

## 2017-03-28 NOTE — Patient Instructions (Signed)
Laparoscopic Tubal Ligation Laparoscopic tubal ligation is a procedure to close the fallopian tubes. This is done so that you cannot get pregnant. When the fallopian tubes are closed, the eggs that your ovaries release cannot enter the uterus, and sperm cannot reach the released eggs. A laparoscopic tubal ligation is sometimes called "getting your tubes tied." You should not have this procedure if you want to get pregnant someday or if you are unsure about having more children. Tell a health care provider about:  Any allergies you have.  All medicines you are taking, including vitamins, herbs, eye drops, creams, and over-the-counter medicines.  Any problems you or family members have had with anesthetic medicines.  Any blood disorders you have.  Any surgeries you have had.  Any medical conditions you have.  Whether you are pregnant or may be pregnant.  Any past pregnancies. What are the risks? Generally, this is a safe procedure. However, problems may occur, including:  Infection.  Bleeding.  Injury to surrounding organs.  Side effects from anesthetics.  Failure of the procedure.  This procedure can increase your risk of a kind of pregnancy in which a fertilized egg attaches to the outside of the uterus (ectopic pregnancy). What happens before the procedure?  Ask your health care provider about: ? Changing or stopping your regular medicines. This is especially important if you are taking diabetes medicines or blood thinners. ? Taking medicines such as aspirin and ibuprofen. These medicines can thin your blood. Do not take these medicines before your procedure if your health care provider instructs you not to.  Follow instructions from your health care provider about eating and drinking restrictions.  Plan to have someone take you home after the procedure.  If you go home right after the procedure, plan to have someone with you for 24 hours. What happens during the  procedure?  You will be given one or more of the following: ? A medicine to help you relax (sedative). ? A medicine to numb the area (local anesthetic). ? A medicine to make you fall asleep (general anesthetic). ? A medicine that is injected into an area of your body to numb everything below the injection site (regional anesthetic).  An IV tube will be inserted into one of your veins. It will be used to give you medicines and fluids during the procedure.  Your bladder may be emptied with a small tube (catheter).  If you have been given a general anesthetic, a tube will be put down your throat to help you breathe.  Two small cuts (incisions) will be made in your lower abdomen and near your belly button.  Your abdomen will be inflated with a gas. This will let the surgeon see better and will give the surgeon room to work.  A thin, lighted tube (laparoscope) with a camera attached will be inserted into your abdomen through one of the incisions. Small instruments will be inserted through the other incision.  The fallopian tubes will be tied off, burned (cauterized), or blocked with a clip, ring, or clamp. A small portion in the center of each fallopian tube may be removed.  The gas will be released from the abdomen.  The incisions will be closed with stitches (sutures).  A bandage (dressing) will be placed over the incisions. The procedure may vary among health care providers and hospitals. What happens after the procedure?  Your blood pressure, heart rate, breathing rate, and blood oxygen level will be monitored often until the medicines you   were given have worn off.  You will be given medicine to help with pain, nausea, and vomiting as needed. This information is not intended to replace advice given to you by your health care provider. Make sure you discuss any questions you have with your health care provider. Document Released: 08/13/2000 Document Revised: 10/13/2015 Document  Reviewed: 04/17/2015 Elsevier Interactive Patient Education  2018 Elsevier Inc.  

## 2017-03-28 NOTE — Progress Notes (Signed)
Subjective:   Lauren Davis is a 36 y.o. 102P1002 Caucasian female here for a routine well-woman exam.  No LMP recorded. Patient is not currently having periods (Reason: Oral contraceptives).    Current complaints: none PCP: me       does desire labs  Social History: Sexual: heterosexual Marital Status: married Living situation: with family Occupation: Print production planneroffice manager at [employer] Tobacco/alcohol: no tobacco use Illicit drugs: no history of illicit drug use  The following portions of the patient's history were reviewed and updated as appropriate: allergies, current medications, past family history, past medical history, past social history, past surgical history and problem list.  Past Medical History Past Medical History:  Diagnosis Date  . Constipation   . Fatigue   . Gestational diabetes   . HPV in female   . Vaginal Pap smear, abnormal     Past Surgical History Past Surgical History:  Procedure Laterality Date  . TONSILLECTOMY  2004    Gynecologic History G2P1002  No LMP recorded. Patient is not currently having periods (Reason: Oral contraceptives). Contraception: OCP (estrogen/progesterone) Last Pap: 2017. Results were: normal   Obstetric History OB History  Gravida Para Term Preterm AB Living  2 1 1     2   SAB TAB Ectopic Multiple Live Births        0 2    # Outcome Date GA Lbr Len/2nd Weight Sex Delivery Anes PTL Lv  2 Term 09/05/15 1635w2d / 00:16 7 lb 12 oz (3.515 kg) F Vag-Spont EPI  LIV  1 Gravida 2010    F Vag-Spont   LIV      Current Medications Current Outpatient Medications on File Prior to Visit  Medication Sig Dispense Refill  . acyclovir (ZOVIRAX) 400 MG tablet Take 1 tablet (400 mg total) by mouth 2 (two) times daily. 60 tablet 4  . Norethindrone-Ethinyl Estradiol-Fe Biphas (LO LOESTRIN FE) 1 MG-10 MCG / 10 MCG tablet Take 1 tablet by mouth daily. 1 Package 11  . terconazole (TERAZOL 7) 0.4 % vaginal cream Place 1 applicator  vaginally at bedtime. (Patient not taking: Reported on 03/28/2017) 45 g 1   No current facility-administered medications on file prior to visit.     Review of Systems Patient denies any headaches, blurred vision, shortness of breath, chest pain, abdominal pain, problems with bowel movements, urination, or intercourse.  Objective:  BP 120/80   Pulse 84   Ht 5\' 8"  (1.727 m)   Wt 152 lb 1.6 oz (69 kg)   BMI 23.13 kg/m  Physical Exam  General:  Well developed, well nourished, no acute distress. She is alert and oriented x3. Skin:  Warm and dry Neck:  Midline trachea, no thyromegaly or nodules Cardiovascular: Regular rate and rhythm, no murmur heard Lungs:  Effort normal, all lung fields clear to auscultation bilaterally Breasts:  No dominant palpable mass, retraction, or nipple discharge Abdomen:  Soft, non tender, no hepatosplenomegaly or masses Pelvic:  External genitalia is normal in appearance.  The vagina is normal in appearance. The cervix is bulbous, no CMT.  Thin prep pap is not done . Uterus is felt to be normal size, shape, and contour.  No adnexal masses or tenderness noted. Extremities:  No swelling or varicosities noted Psych:  She has a normal mood and affect  Assessment:   Healthy well-woman exam Desires permanent sterilization Plan:  Consult with Dr Valentino Saxonherry to discuss BTL F/U 1 year for AE, or sooner if needed   Enio Hornback N  Gayla Medicus, CNM

## 2017-03-29 LAB — COMPREHENSIVE METABOLIC PANEL
A/G RATIO: 1.6 (ref 1.2–2.2)
ALK PHOS: 47 IU/L (ref 39–117)
ALT: 16 IU/L (ref 0–32)
AST: 16 IU/L (ref 0–40)
Albumin: 4.5 g/dL (ref 3.5–5.5)
BILIRUBIN TOTAL: 0.5 mg/dL (ref 0.0–1.2)
BUN/Creatinine Ratio: 14 (ref 9–23)
BUN: 11 mg/dL (ref 6–20)
CHLORIDE: 104 mmol/L (ref 96–106)
CO2: 23 mmol/L (ref 20–29)
Calcium: 9.9 mg/dL (ref 8.7–10.2)
Creatinine, Ser: 0.8 mg/dL (ref 0.57–1.00)
GFR calc Af Amer: 110 mL/min/{1.73_m2} (ref >59)
GFR calc non Af Amer: 95 mL/min/{1.73_m2} (ref >59)
Globulin, Total: 2.8 g/dL (ref 1.5–4.5)
Glucose: 95 mg/dL (ref 65–99)
POTASSIUM: 4.9 mmol/L (ref 3.5–5.2)
Sodium: 140 mmol/L (ref 134–144)
Total Protein: 7.3 g/dL (ref 6.0–8.5)

## 2017-03-29 LAB — LIPID PANEL
CHOL/HDL RATIO: 3.6 ratio (ref 0.0–4.4)
Cholesterol, Total: 188 mg/dL (ref 100–199)
HDL: 52 mg/dL (ref >39)
LDL CALC: 117 mg/dL — AB (ref 0–99)
TRIGLYCERIDES: 93 mg/dL (ref 0–149)
VLDL Cholesterol Cal: 19 mg/dL (ref 5–40)

## 2017-03-29 LAB — CBC
HEMOGLOBIN: 14.9 g/dL (ref 11.1–15.9)
Hematocrit: 44.2 % (ref 34.0–46.6)
MCH: 30.3 pg (ref 26.6–33.0)
MCHC: 33.7 g/dL (ref 31.5–35.7)
MCV: 90 fL (ref 79–97)
PLATELETS: 303 10*3/uL (ref 150–379)
RBC: 4.91 x10E6/uL (ref 3.77–5.28)
RDW: 13.3 % (ref 12.3–15.4)
WBC: 7.5 10*3/uL (ref 3.4–10.8)

## 2017-03-29 LAB — FERRITIN: Ferritin: 49 ng/mL (ref 15–150)

## 2017-03-29 LAB — TSH: TSH: 2.34 u[IU]/mL (ref 0.450–4.500)

## 2017-04-09 ENCOUNTER — Ambulatory Visit (INDEPENDENT_AMBULATORY_CARE_PROVIDER_SITE_OTHER): Payer: 59 | Admitting: Obstetrics and Gynecology

## 2017-04-09 ENCOUNTER — Encounter: Payer: Self-pay | Admitting: Obstetrics and Gynecology

## 2017-04-09 VITALS — BP 132/84 | HR 90 | Ht 68.0 in | Wt 151.3 lb

## 2017-04-09 DIAGNOSIS — Z308 Encounter for other contraceptive management: Secondary | ICD-10-CM

## 2017-04-09 NOTE — H&P (Signed)
 @[facility] @   GYNECOLOGY PREOPERATIVE HISTORY AND PHYSICAL   Subjective:  Lauren Davis is a 36 y.o. G2P1002 here as she desires permanent sterility with tubal ligation (laparoscopic bilateral salpingectomy). No significant preoperative concerns.   Pertinent Gynecological History: Menses: flow is moderate and regular every month without intermenstrual spotting Contraception: OCP (estrogen/progesterone) Last pap: normal Date: 2017   Past Medical History:  Diagnosis Date  . Constipation   . Fatigue   . Gestational diabetes   . HPV in female   . Vaginal Pap smear, abnormal    Past Surgical History:  Procedure Laterality Date  . TONSILLECTOMY  2004   OB History  Gravida Para Term Preterm AB Living  2 1 1     2   SAB TAB Ectopic Multiple Live Births        0 2    # Outcome Date GA Lbr Len/2nd Weight Sex Delivery Anes PTL Lv  2 Term 09/05/15 3057w2d / 00:16 7 lb 12 oz (3.515 kg) F Vag-Spont EPI  LIV  1 Gravida 2010    F Vag-Spont   LIV    Patient denies any other pertinent gynecologic issues.  Family History  Problem Relation Age of Onset  . Diabetes Mother   . Heart disease Maternal Grandmother   . Diabetes Maternal Grandmother   . Diabetes Maternal Grandfather   . Heart disease Paternal Grandmother   . Heart disease Paternal Grandfather    Social History   Socioeconomic History  . Marital status: Married    Spouse name: Not on file  . Number of children: Not on file  . Years of education: Not on file  . Highest education level: Not on file  Social Needs  . Financial resource strain: Not on file  . Food insecurity - worry: Not on file  . Food insecurity - inability: Not on file  . Transportation needs - medical: Not on file  . Transportation needs - non-medical: Not on file  Occupational History  . Not on file  Tobacco Use  . Smoking status: Never Smoker  . Smokeless tobacco: Never Used  Substance and Sexual Activity  . Alcohol use: No    Alcohol/week:  0.0 oz  . Drug use: No  . Sexual activity: Yes    Birth control/protection: None, Pill  Other Topics Concern  . Not on file  Social History Narrative  . Not on file   Current Outpatient Medications on File Prior to Visit  Medication Sig Dispense Refill  . acyclovir (ZOVIRAX) 400 MG tablet Take 1 tablet (400 mg total) by mouth 2 (two) times daily. 60 tablet 4  . Norethindrone-Ethinyl Estradiol-Fe Biphas (LO LOESTRIN FE) 1 MG-10 MCG / 10 MCG tablet Take 1 tablet daily by mouth. 1 Package 11  . terconazole (TERAZOL 7) 0.4 % vaginal cream Place 1 applicator vaginally at bedtime. (Patient not taking: Reported on 03/28/2017) 45 g 1   No current facility-administered medications on file prior to visit.    Allergies  Allergen Reactions  . Sulfa Antibiotics Rash     Review of Systems Constitutional: No recent fever/chills/sweats Respiratory: No recent cough/bronchitis Cardiovascular: No chest pain Gastrointestinal: No recent nausea/vomiting/diarrhea Genitourinary: No UTI symptoms Hematologic/lymphatic:No history of coagulopathy or recent blood thinner use    Objective:   Blood pressure 132/84, pulse 90, height 5\' 8"  (1.727 m), weight 151 lb 4.8 oz (68.6 kg), not currently breastfeeding. CONSTITUTIONAL: Well-developed, well-nourished female in no acute distress.  HENT:  Normocephalic, atraumatic, External right and left  ear normal. Oropharynx is clear and moist EYES: Conjunctivae and EOM are normal. Pupils are equal, round, and reactive to light. No scleral icterus.  NECK: Normal range of motion, supple, no masses SKIN: Skin is warm and dry. No rash noted. Not diaphoretic. No erythema. No pallor. NEUROLOGIC: Alert and oriented to person, place, and time. Normal reflexes, muscle tone coordination. No cranial nerve deficit noted. PSYCHIATRIC: Normal mood and affect. Normal behavior. Normal judgment and thought content. CARDIOVASCULAR: Normal heart rate noted, regular rhythm RESPIRATORY:  Effort and breath sounds normal, no problems with respiration noted ABDOMEN: Soft, nontender, nondistended. PELVIC: Deferred MUSCULOSKELETAL: Normal range of motion. No edema and no tenderness. 2+ distal pulses.    Labs: Results for orders placed or performed in visit on 03/28/17 (from the past 336 hour(s))  CBC   Collection Time: 03/28/17  8:43 AM  Result Value Ref Range   WBC 7.5 3.4 - 10.8 x10E3/uL   RBC 4.91 3.77 - 5.28 x10E6/uL   Hemoglobin 14.9 11.1 - 15.9 g/dL   Hematocrit 16.144.2 09.634.0 - 46.6 %   MCV 90 79 - 97 fL   MCH 30.3 26.6 - 33.0 pg   MCHC 33.7 31.5 - 35.7 g/dL   RDW 04.513.3 40.912.3 - 81.115.4 %   Platelets 303 150 - 379 x10E3/uL  Ferritin   Collection Time: 03/28/17  8:43 AM  Result Value Ref Range   Ferritin 49 15 - 150 ng/mL  TSH   Collection Time: 03/28/17  8:43 AM  Result Value Ref Range   TSH 2.340 0.450 - 4.500 uIU/mL  Lipid panel   Collection Time: 03/28/17  8:43 AM  Result Value Ref Range   Cholesterol, Total 188 100 - 199 mg/dL   Triglycerides 93 0 - 149 mg/dL   HDL 52 >91>39 mg/dL   VLDL Cholesterol Cal 19 5 - 40 mg/dL   LDL Calculated 478117 (H) 0 - 99 mg/dL   Chol/HDL Ratio 3.6 0.0 - 4.4 ratio  Comprehensive metabolic panel   Collection Time: 03/28/17  8:43 AM  Result Value Ref Range   Glucose 95 65 - 99 mg/dL   BUN 11 6 - 20 mg/dL   Creatinine, Ser 2.950.80 0.57 - 1.00 mg/dL   GFR calc non Af Amer 95 >59 mL/min/1.73   GFR calc Af Amer 110 >59 mL/min/1.73   BUN/Creatinine Ratio 14 9 - 23   Sodium 140 134 - 144 mmol/L   Potassium 4.9 3.5 - 5.2 mmol/L   Chloride 104 96 - 106 mmol/L   CO2 23 20 - 29 mmol/L   Calcium 9.9 8.7 - 10.2 mg/dL   Total Protein 7.3 6.0 - 8.5 g/dL   Albumin 4.5 3.5 - 5.5 g/dL   Globulin, Total 2.8 1.5 - 4.5 g/dL   Albumin/Globulin Ratio 1.6 1.2 - 2.2   Bilirubin Total 0.5 0.0 - 1.2 mg/dL   Alkaline Phosphatase 47 39 - 117 IU/L   AST 16 0 - 40 IU/L   ALT 16 0 - 32 IU/L     Imaging Studies: No results found.  Assessment:     Multiparity, desiring permanent sterilization   Plan:   - Counseling: Procedure, risks, reasons, benefits and complications (including injury to bowel, bladder, major blood vessel, ureter, bleeding, possibility of transfusion, infection, or fistula formation) reviewed in detail. Likelihood of success in alleviating the patient's condition was discussed. Routine postoperative instructions will be reviewed with the patient and her family in detail after surgery.  The patient concurred with the proposed plan,  giving informed written consent for the surgery.   - Patient desires bilateral tubal sterilization.  Other reversible forms of contraception were discussed with patient; she declines all other modalities. Discussed bilateral tubal sterilization in detail; discussed options of laparoscopic bilateral tubal sterilization using Filshie clips vs laparoscopic bilateral salpingectomy. Risks and benefits discussed in detail including but not limited to: risk of regret, permanence of method, bleeding, infection, injury to surrounding organs and need for additional procedures.  Failure risk of 1-2 % for Filshie clips and <1% for bilateral salpingectomy with increased risk of ectopic gestation if pregnancy occurs was also discussed with patient.  Also discussed possible reduction of risk of ovarian cancer via bilateral salpingectomy given that a growing body of knowledge reveals that the majority of cases of high grade serous "ovarian" cancer actually are actually  cancers arising from the fimbriated end of the fallopian tubes. Emphasized that removal of fallopian tubes do not result in any known hormonal imbalance.  Patient verbalized understanding of these risks and benefits and wants to proceed with sterilization with laparoscopic bilateral salpingectomy.   - Preop testing ordered. - Instructions reviewed, including NPO after midnight.      Hildred Laser, MD [facility]

## 2017-04-09 NOTE — Patient Instructions (Signed)
Laparoscopic Tubal Ligation Laparoscopic tubal ligation is a procedure to close the fallopian tubes. This is done so that you cannot get pregnant. When the fallopian tubes are closed, the eggs that your ovaries release cannot enter the uterus, and sperm cannot reach the released eggs. A laparoscopic tubal ligation is sometimes called "getting your tubes tied." You should not have this procedure if you want to get pregnant someday or if you are unsure about having more children. Tell a health care provider about:  Any allergies you have.  All medicines you are taking, including vitamins, herbs, eye drops, creams, and over-the-counter medicines.  Any problems you or family members have had with anesthetic medicines.  Any blood disorders you have.  Any surgeries you have had.  Any medical conditions you have.  Whether you are pregnant or may be pregnant.  Any past pregnancies. What are the risks? Generally, this is a safe procedure. However, problems may occur, including:  Infection.  Bleeding.  Injury to surrounding organs.  Side effects from anesthetics.  Failure of the procedure.  This procedure can increase your risk of a kind of pregnancy in which a fertilized egg attaches to the outside of the uterus (ectopic pregnancy). What happens before the procedure?  Ask your health care provider about: ? Changing or stopping your regular medicines. This is especially important if you are taking diabetes medicines or blood thinners. ? Taking medicines such as aspirin and ibuprofen. These medicines can thin your blood. Do not take these medicines before your procedure if your health care provider instructs you not to.  Follow instructions from your health care provider about eating and drinking restrictions.  Plan to have someone take you home after the procedure.  If you go home right after the procedure, plan to have someone with you for 24 hours. What happens during the  procedure?  You will be given one or more of the following: ? A medicine to help you relax (sedative). ? A medicine to numb the area (local anesthetic). ? A medicine to make you fall asleep (general anesthetic). ? A medicine that is injected into an area of your body to numb everything below the injection site (regional anesthetic).  An IV tube will be inserted into one of your veins. It will be used to give you medicines and fluids during the procedure.  Your bladder may be emptied with a small tube (catheter).  If you have been given a general anesthetic, a tube will be put down your throat to help you breathe.  Two small cuts (incisions) will be made in your lower abdomen and near your belly button.  Your abdomen will be inflated with a gas. This will let the surgeon see better and will give the surgeon room to work.  A thin, lighted tube (laparoscope) with a camera attached will be inserted into your abdomen through one of the incisions. Small instruments will be inserted through the other incision.  The fallopian tubes will be tied off, burned (cauterized), or blocked with a clip, ring, or clamp. A small portion in the center of each fallopian tube may be removed.  The gas will be released from the abdomen.  The incisions will be closed with stitches (sutures).  A bandage (dressing) will be placed over the incisions. The procedure may vary among health care providers and hospitals. What happens after the procedure?  Your blood pressure, heart rate, breathing rate, and blood oxygen level will be monitored often until the medicines you   were given have worn off.  You will be given medicine to help with pain, nausea, and vomiting as needed. This information is not intended to replace advice given to you by your health care provider. Make sure you discuss any questions you have with your health care provider. Document Released: 08/13/2000 Document Revised: 10/13/2015 Document  Reviewed: 04/17/2015 Elsevier Interactive Patient Education  2018 Elsevier Inc.  

## 2017-04-09 NOTE — Progress Notes (Signed)
    GYNECOLOGY PROGRESS NOTE  Subjective:    Patient ID: Flossie Dibbleina L Shaddock, female    DOB: 04/21/1981, 36 y.o.   MRN: [id number]  HPI  Patient is a 36 y.o. 582P1002 female who presents for discussion for tubal ligation.  She is currently on OCPs, but would like permanent sterilization.    The following portions of the patient's history were reviewed and updated as appropriate: allergies, current medications, past family history, past medical history, past social history, past surgical history and problem list.  Review of Systems Pertinent items noted in HPI and remainder of comprehensive ROS otherwise negative.   Objective:   Blood pressure 132/84, pulse 90, height 5\' 8"  (1.727 m), weight 151 lb 4.8 oz (68.6 kg), not currently breastfeeding. General appearance: alert and no distress Abdomen: soft, non-tender; bowel sounds normal; no masses,  no organomegaly Pelvic: deferred Extremities: extremities normal, atraumatic, no cyanosis or edema Neurologic: Grossly normal   Assessment:   Encounter for tubal ligation counseling  Plan:  Patient desires bilateral tubal sterilization.  Other reversible forms of contraception were discussed with patient; she declines all other modalities. Discussed bilateral tubal sterilization in detail; discussed options of laparoscopic bilateral tubal sterilization using Filshie clips vs laparoscopic bilateral salpingectomy. Risks and benefits discussed in detail including but not limited to: risk of regret, permanence of method, bleeding, infection, injury to surrounding organs and need for additional procedures.  Failure risk of 1-2 % for Filshie clips and <1% for bilateral salpingectomy with increased risk of ectopic gestation if pregnancy occurs was also discussed with patient.  Also discussed possible reduction of risk of ovarian cancer via bilateral salpingectomy given that a growing body of knowledge reveals that the majority of cases of high grade serous  "ovarian" cancer actually are actually  cancers arising from the fimbriated end of the fallopian tubes. Emphasized that removal of fallopian tubes do not result in any known hormonal imbalance.  Patient verbalized understanding of these risks and benefits and wants to proceed with sterilization with laparoscopic bilateral salpingectomy.    Patient desires surgery on 05/06/2017.  She was told that she will be contacted by our surgical scheduler regarding the time and date of her surgery; routine preoperative instructions of having nothing to eat or drink after midnight on the day prior to surgery and also coming to the hospital 1 1/2 hours prior to her time of surgery were also emphasized.  She was told she may be called for a preoperative appointment about a week prior to surgery and will be given further preoperative instructions at that visit.  Routine postoperative instructions will be reviewed with the patient and her family in detail after surgery. Printed patient education handouts about the procedure was given to the patient to review at home.   A total of 15 minutes were spent face-to-face with the patient during this encounter and over half of that time dealt with counseling and coordination of care.   [name], Nyles Mitton, MD [facility]

## 2017-04-30 ENCOUNTER — Encounter: Payer: 59 | Admitting: Obstetrics and Gynecology

## 2017-05-02 ENCOUNTER — Encounter
Admission: RE | Admit: 2017-05-02 | Discharge: 2017-05-02 | Disposition: A | Discharge status: Home / Self Care | Payer: 59 | Source: Ambulatory Visit | Attending: Obstetrics and Gynecology | Admitting: Obstetrics and Gynecology

## 2017-05-02 HISTORY — DX: Cardiac arrhythmia, unspecified: I49.9

## 2017-05-02 HISTORY — DX: Other complications of anesthesia, initial encounter: T88.59XA

## 2017-05-02 HISTORY — DX: Adverse effect of unspecified anesthetic, initial encounter: T41.45XA

## 2017-05-02 NOTE — Patient Instructions (Signed)
Your procedure is scheduled on: 05/06/17 Report to Day Surgery. [facility] To find out your arrival time please call [phone] between 1PM - 3PM on 05/03/17  Remember: Instructions that are not followed completely may result in serious medical risk, up to and including death, or upon the discretion of your surgeon and anesthesiologist your surgery may need to be rescheduled.     _X__ 1. Do not eat food after midnight the night before your procedure.                 No gum chewing or hard candies. You may drink clear liquids up to 2 hours                 before you are scheduled to arrive for your surgery- DO not drink clear                 liquids within 2 hours of the start of your surgery.                 Clear Liquids include:  water, apple juice without pulp, clear carbohydrate                 drink such as Clearfast of Gartorade, Black Coffee or Tea (Do not add                 anything to coffee or tea).     _X__ 2.  No Alcohol for 24 hours before or after surgery.   _X__ 3.  Do Not Smoke or use e-cigarettes For 24 Hours Prior to Your Surgery.                 Do not use any chewable tobacco products for at least 6 hours prior to                 surgery.  ____  4.  Bring all medications with you on the day of surgery if instructed.   X____  5.  Notify your doctor if there is any change in your medical condition      (cold, fever, infections).     Do not wear jewelry, make-up, hairpins, clips or nail polish. Do not wear lotions, powders, or perfumes. You may wear deodorant. Do not shave 48 hours prior to surgery. Men may shave face and neck. Do not bring valuables to the hospital.    [facility] belongings or valuables.  Contacts, dentures or bridgework may not be worn into surgery. Leave your suitcase in the car. After surgery it may be brought to your room. For patients admitted to the hospital, discharge  time is determined by your treatment team.   Patients discharged the day of surgery will not be allowed to drive home.   ____ Take these medicines the morning of surgery with A SIP OF WATER:    1. NONE  2.   3.   4.  5.  6.  ____ Fleet Enema (as directed)   ____ Use CHG Soap as directed  ____ Use inhalers on the day of surgery  ____ Stop metformin 2 days prior to surgery    ____ Take 1/2 of usual insulin dose the night before surgery. No insulin the morning          of surgery.   ____ Stop Coumadin/Plavix/aspirin on  __X__ Stop Anti-inflammatories on  05/02/17   ____ Stop supplements until after surgery.  ____ Bring C-Pap to the hospital.

## 2017-05-06 ENCOUNTER — Ambulatory Visit
Admission: RE | Admit: 2017-05-06 | Discharge: 2017-05-06 | Disposition: A | Discharge status: Home / Self Care | Payer: 59 | Source: Ambulatory Visit | Attending: Obstetrics and Gynecology | Admitting: Obstetrics and Gynecology

## 2017-05-06 ENCOUNTER — Ambulatory Visit: Payer: 59 | Admitting: Anesthesiology

## 2017-05-06 ENCOUNTER — Encounter: Payer: Self-pay | Admitting: *Deleted

## 2017-05-06 ENCOUNTER — Encounter
Admission: RE | Disposition: A | Discharge status: Home / Self Care | Payer: Self-pay | Source: Ambulatory Visit | Attending: Obstetrics and Gynecology

## 2017-05-06 DIAGNOSIS — Z302 Encounter for sterilization: Secondary | ICD-10-CM | POA: Diagnosis not present

## 2017-05-06 HISTORY — PX: LAPAROSCOPIC TUBAL LIGATION: SHX1937

## 2017-05-06 LAB — CBC
HEMATOCRIT: 44.5 % (ref 35.0–47.0)
Hemoglobin: 14.6 g/dL (ref 12.0–16.0)
MCH: 30.4 pg (ref 26.0–34.0)
MCHC: 32.9 g/dL (ref 32.0–36.0)
MCV: 92.4 fL (ref 80.0–100.0)
Platelets: 285 10*3/uL (ref 150–440)
RBC: 4.81 MIL/uL (ref 3.80–5.20)
RDW: 12.8 % (ref 11.5–14.5)
WBC: 8.6 10*3/uL (ref 3.6–11.0)

## 2017-05-06 LAB — POCT PREGNANCY, URINE: Preg Test, Ur: NEGATIVE

## 2017-05-06 SURGERY — LIGATION, FALLOPIAN TUBE, LAPAROSCOPIC
Anesthesia: General | Site: Abdomen | Laterality: Bilateral | Wound class: Clean Contaminated

## 2017-05-06 MED ORDER — SODIUM CHLORIDE FLUSH 0.9 % IV SOLN
INTRAVENOUS | Status: AC
  Filled 2017-05-06: qty 3

## 2017-05-06 MED ORDER — MIDAZOLAM HCL 2 MG/2ML IJ SOLN
INTRAMUSCULAR | Status: AC
  Filled 2017-05-06: qty 2

## 2017-05-06 MED ORDER — GLYCOPYRROLATE 0.2 MG/ML IJ SOLN
INTRAMUSCULAR | Status: AC
  Filled 2017-05-06: qty 1

## 2017-05-06 MED ORDER — ONDANSETRON HCL 4 MG/2ML IJ SOLN
INTRAMUSCULAR | Status: AC
  Filled 2017-05-06: qty 2

## 2017-05-06 MED ORDER — PHENYLEPHRINE HCL 10 MG/ML IJ SOLN
INTRAMUSCULAR | Status: AC
  Filled 2017-05-06: qty 1

## 2017-05-06 MED ORDER — FENTANYL CITRATE (PF) 100 MCG/2ML IJ SOLN: 25.0000 ug | INTRAMUSCULAR | Status: DC | PRN

## 2017-05-06 MED ORDER — PROMETHAZINE HCL 25 MG/ML IJ SOLN
6.2500 mg | Freq: Once | INTRAMUSCULAR | Status: AC | PRN
  Administered 2017-05-06: 6.25 mg via INTRAVENOUS

## 2017-05-06 MED ORDER — LACTATED RINGERS IV SOLN
INTRAVENOUS | Status: DC
  Administered 2017-05-06: 07:00:00 via INTRAVENOUS

## 2017-05-06 MED ORDER — SEVOFLURANE IN SOLN
RESPIRATORY_TRACT | Status: AC
  Filled 2017-05-06: qty 250

## 2017-05-06 MED ORDER — DEXAMETHASONE SODIUM PHOSPHATE 10 MG/ML IJ SOLN
INTRAMUSCULAR | Status: DC | PRN
  Administered 2017-05-06: 5 mg via INTRAVENOUS

## 2017-05-06 MED ORDER — FAMOTIDINE 20 MG PO TABS
ORAL_TABLET | ORAL | Status: AC
  Filled 2017-05-06: qty 1

## 2017-05-06 MED ORDER — ONDANSETRON HCL 4 MG/2ML IJ SOLN
4.0000 mg | Freq: Once | INTRAMUSCULAR | Status: AC | PRN
  Administered 2017-05-06: 4 mg via INTRAVENOUS

## 2017-05-06 MED ORDER — MIDAZOLAM HCL 2 MG/2ML IJ SOLN
INTRAMUSCULAR | Status: DC | PRN
  Administered 2017-05-06: 2 mg via INTRAVENOUS

## 2017-05-06 MED ORDER — OXYCODONE-ACETAMINOPHEN 5-325 MG PO TABS: 1.0000 | ORAL_TABLET | Freq: Four times a day (QID) | ORAL | 0 refills | Status: DC | PRN

## 2017-05-06 MED ORDER — ROCURONIUM BROMIDE 50 MG/5ML IV SOLN
INTRAVENOUS | Status: AC
  Filled 2017-05-06: qty 1

## 2017-05-06 MED ORDER — LIDOCAINE HCL (CARDIAC) 20 MG/ML IV SOLN
INTRAVENOUS | Status: DC | PRN
  Administered 2017-05-06: 50 mg via INTRAVENOUS

## 2017-05-06 MED ORDER — FENTANYL CITRATE (PF) 100 MCG/2ML IJ SOLN
INTRAMUSCULAR | Status: AC
  Filled 2017-05-06: qty 2

## 2017-05-06 MED ORDER — SUCCINYLCHOLINE CHLORIDE 20 MG/ML IJ SOLN
INTRAMUSCULAR | Status: AC
  Filled 2017-05-06: qty 1

## 2017-05-06 MED ORDER — BUPIVACAINE HCL (PF) 0.5 % IJ SOLN
INTRAMUSCULAR | Status: AC
  Filled 2017-05-06: qty 30

## 2017-05-06 MED ORDER — DEXAMETHASONE SODIUM PHOSPHATE 10 MG/ML IJ SOLN
INTRAMUSCULAR | Status: AC
  Filled 2017-05-06: qty 1

## 2017-05-06 MED ORDER — FENTANYL CITRATE (PF) 100 MCG/2ML IJ SOLN
INTRAMUSCULAR | Status: DC | PRN
  Administered 2017-05-06 (×2): 50 ug via INTRAVENOUS

## 2017-05-06 MED ORDER — DOCUSATE SODIUM 100 MG PO CAPS: 100.0000 mg | ORAL_CAPSULE | Freq: Two times a day (BID) | ORAL | 2 refills | Status: DC | PRN

## 2017-05-06 MED ORDER — SIMETHICONE 80 MG PO CHEW: 80.0000 mg | CHEWABLE_TABLET | Freq: Four times a day (QID) | ORAL | 2 refills | Status: DC | PRN

## 2017-05-06 MED ORDER — PROPOFOL 10 MG/ML IV BOLUS
INTRAVENOUS | Status: DC | PRN
  Administered 2017-05-06: 150 mg via INTRAVENOUS

## 2017-05-06 MED ORDER — PROMETHAZINE HCL 25 MG/ML IJ SOLN
INTRAMUSCULAR | Status: AC
  Filled 2017-05-06: qty 1

## 2017-05-06 MED ORDER — FAMOTIDINE 20 MG PO TABS
20.0000 mg | ORAL_TABLET | Freq: Once | ORAL | Status: AC
  Administered 2017-05-06: 20 mg via ORAL

## 2017-05-06 MED ORDER — KETOROLAC TROMETHAMINE 30 MG/ML IJ SOLN
INTRAMUSCULAR | Status: AC
  Filled 2017-05-06: qty 1

## 2017-05-06 MED ORDER — ONDANSETRON HCL 4 MG/2ML IJ SOLN
INTRAMUSCULAR | Status: DC | PRN
  Administered 2017-05-06: 4 mg via INTRAVENOUS

## 2017-05-06 MED ORDER — EPHEDRINE SULFATE 50 MG/ML IJ SOLN
INTRAMUSCULAR | Status: AC
  Filled 2017-05-06: qty 1

## 2017-05-06 MED ORDER — LIDOCAINE HCL (PF) 2 % IJ SOLN
INTRAMUSCULAR | Status: AC
  Filled 2017-05-06: qty 10

## 2017-05-06 MED ORDER — BUPIVACAINE HCL (PF) 0.5 % IJ SOLN
INTRAMUSCULAR | Status: DC | PRN
  Administered 2017-05-06: 9 mL

## 2017-05-06 MED ORDER — SODIUM CHLORIDE 0.9 % IJ SOLN
INTRAMUSCULAR | Status: AC
  Filled 2017-05-06: qty 10

## 2017-05-06 MED ORDER — IBUPROFEN 800 MG PO TABS: 800.0000 mg | ORAL_TABLET | Freq: Three times a day (TID) | ORAL | 1 refills | Status: DC | PRN

## 2017-05-06 MED ORDER — PROPOFOL 10 MG/ML IV BOLUS
INTRAVENOUS | Status: AC
  Filled 2017-05-06: qty 20

## 2017-05-06 MED ORDER — KETOROLAC TROMETHAMINE 30 MG/ML IJ SOLN
INTRAMUSCULAR | Status: DC | PRN
  Administered 2017-05-06: 15 mg via INTRAVENOUS

## 2017-05-06 MED ORDER — SUCCINYLCHOLINE CHLORIDE 20 MG/ML IJ SOLN
INTRAMUSCULAR | Status: DC | PRN
  Administered 2017-05-06: 80 mg via INTRAVENOUS

## 2017-05-06 SURGICAL SUPPLY — 20 items
BLADE SURG SZ11 CARB STEEL (BLADE) ×2 IMPLANT
CATH ROBINSON RED A/P 16FR (CATHETERS) ×2 IMPLANT
CHLORAPREP W/TINT 26ML (MISCELLANEOUS) ×2 IMPLANT
DERMABOND ADVANCED (GAUZE/BANDAGES/DRESSINGS) ×1
DERMABOND ADVANCED .7 DNX12 (GAUZE/BANDAGES/DRESSINGS) ×1 IMPLANT
GLOVE BIO SURGEON STRL SZ 6.5 (GLOVE) ×2 IMPLANT
GLOVE INDICATOR 7.0 STRL GRN (GLOVE) ×2 IMPLANT
GOWN STRL REUS W/ TWL LRG LVL3 (GOWN DISPOSABLE) ×2 IMPLANT
GOWN STRL REUS W/TWL LRG LVL3 (GOWN DISPOSABLE) ×2
KIT RM TURNOVER CYSTO AR (KITS) ×2 IMPLANT
LABEL OR SOLS (LABEL) ×2 IMPLANT
NS IRRIG 500ML POUR BTL (IV SOLUTION) ×2 IMPLANT
PACK GYN LAPAROSCOPIC (MISCELLANEOUS) ×2 IMPLANT
PAD PREP 24X41 OB/GYN DISP (PERSONAL CARE ITEMS) ×2 IMPLANT
SHEARS ENDO 5MM LNG  ASK BEFOR (MISCELLANEOUS) ×1
SHEARS ENDO 5MM LNG ASK BEFOR (MISCELLANEOUS) ×1 IMPLANT
SLEEVE ENDOPATH XCEL 5M (ENDOMECHANICALS) ×4 IMPLANT
SUT VIC AB 0 CT2 27 (SUTURE) ×2 IMPLANT
TROCAR ENDO BLADELESS 11MM (ENDOMECHANICALS) ×2 IMPLANT
TUBING INSUFFLATION (TUBING) ×2 IMPLANT

## 2017-05-06 NOTE — Discharge Instructions (Addendum)
Laparoscopic Tubal Ligation, Care After °Refer to this sheet in the next few weeks. These instructions provide you with information about caring for yourself after your procedure. Your health care provider may also give you more specific instructions. Your treatment has been planned according to current medical practices, but problems sometimes occur. Call your health care provider if you have any problems or questions after your procedure. °What can I expect after the procedure? °After the procedure, it is common to have: °· A sore throat. °· Discomfort in your shoulder. °· Mild discomfort or cramping in your abdomen. °· Gas pains. °· Pain or soreness in the area where the surgical cut (incision) was made. °· A bloated feeling. °· Tiredness. °· Nausea. °· Vomiting. ° °Follow these instructions at home: °Medicines °· Take over-the-counter and prescription medicines only as told by your health care provider. °· Do not take aspirin because it can cause bleeding. °· Do not drive or operate heavy machinery while taking prescription pain medicine. °Activity °· Rest for the rest of the day. °· Return to your normal activities as told by your health care provider. Ask your health care provider what activities are safe for you. °Incision care ° °· Follow instructions from your health care provider about how to take care of your incision. Make sure you: °? Wash your hands with soap and water before you change your bandage (dressing). If soap and water are not available, use hand sanitizer. °? Change your dressing as told by your health care provider. °? Leave stitches (sutures) in place. They may need to stay in place for 2 weeks or longer. °· Check your incision area every day for signs of infection. Check for: °? More redness, swelling, or pain. °? More fluid or blood. °? Warmth. °? Pus or a bad smell. °Other Instructions °· Do not take baths, swim, or use a hot tub until your health care provider approves. You may take  showers. °· Keep all follow-up visits as told by your health care provider. This is important. °· Have someone help you with your daily household tasks for the first few days. °Contact a health care provider if: °· You have more redness, swelling, or pain around your incision. °· Your incision feels warm to the touch. °· You have pus or a bad smell coming from your incision. °· The edges of your incision break open after the sutures have been removed. °· Your pain does not improve after 2-3 days. °· You have a rash. °· You repeatedly become dizzy or light-headed. °· Your pain medicine is not helping. °· You are constipated. °Get help right away if: °· You have a fever. °· You faint. °· You have increasing pain in your abdomen. °· You have severe pain in one or both of your shoulders. °· You have fluid or blood coming from your sutures or from your vagina. °· You have shortness of breath or difficulty breathing. °· You have chest pain or leg pain. °· You have ongoing nausea, vomiting, or diarrhea. °This information is not intended to replace advice given to you by your health care provider. Make sure you discuss any questions you have with your health care provider. °Document Released: 11/24/2004 Document Revised: 10/10/2015 Document Reviewed: 04/17/2015 °Elsevier Interactive Patient Education © 2018 [employer] ° ° °AMBULATORY SURGERY  °DISCHARGE INSTRUCTIONS ° ° °1) The drugs that you were given will stay in your system until tomorrow so for the next 24 hours you should not: ° °A) Drive   an automobile °B) Make any legal decisions °C) Drink any alcoholic beverage ° ° °2) You may resume regular meals tomorrow.  Today it is better to start with liquids and gradually work up to solid foods. ° °You may eat anything you prefer, but it is better to start with liquids, then soup and crackers, and gradually work up to solid foods. ° ° °3) Please notify your doctor immediately if you have any unusual bleeding, trouble  breathing, redness and pain at the surgery site, drainage, fever, or pain not relieved by medication. ° ° ° °4) Additional Instructions: ° ° ° ° ° ° ° °Please contact your physician with any problems or Same Day Surgery at [phone], Monday through Friday 6 am to 4 pm, or [place] [facility] [place] Main number at [phone]. °

## 2017-05-06 NOTE — Anesthesia Postprocedure Evaluation (Signed)
Anesthesia Post Note  Patient: Lauren Davis  Procedure(s) Performed: LAPAROSCOPIC TUBAL LIGATION (Bilateral Abdomen)  Patient location during evaluation: PACU Anesthesia Type: General Level of consciousness: awake Pain management: pain level controlled Vital Signs Assessment: post-procedure vital signs reviewed and stable Respiratory status: spontaneous breathing Cardiovascular status: stable Anesthetic complications: no     Last Vitals:  Vitals:   05/06/17 0936 05/06/17 0948  BP:  (!) 111/39  Pulse: 69 72  Resp: 19 16  Temp:  (!) 36.3 C  SpO2: 100% 100%    Last Pain:  Vitals:   05/06/17 0948  TempSrc: Temporal  PainSc: 2                  VAN STAVEREN,Nashia Remus

## 2017-05-06 NOTE — Anesthesia Preprocedure Evaluation (Signed)
Anesthesia Evaluation  Patient identified by MRN, date of birth, ID band Patient awake    Reviewed: Allergy & Precautions, NPO status , Patient's Chart, lab work & pertinent test results  History of Anesthesia Complications (+) PONV  Airway Mallampati: I       Dental  (+) Teeth Intact   Pulmonary neg pulmonary ROS,    breath sounds clear to auscultation       Cardiovascular Exercise Tolerance: Good  Rhythm:Regular     Neuro/Psych negative neurological ROS  negative psych ROS   GI/Hepatic negative GI ROS, Neg liver ROS,   Endo/Other  diabetes  Renal/GU negative Renal ROS     Musculoskeletal   Abdominal   Peds negative pediatric ROS (+)  Hematology negative hematology ROS (+)   Anesthesia Other Findings   Reproductive/Obstetrics                             Anesthesia Physical Anesthesia Plan  ASA: I  Anesthesia Plan: General   Post-op Pain Management:    Induction: Intravenous  PONV Risk Score and Plan: 2 and Ondansetron and Dexamethasone  Airway Management Planned: Oral ETT  Additional Equipment:   Intra-op Plan:   Post-operative Plan: Extubation in OR  Informed Consent: I have reviewed the patients History and Physical, chart, labs and discussed the procedure including the risks, benefits and alternatives for the proposed anesthesia with the patient or authorized representative who has indicated his/her understanding and acceptance.     Plan Discussed with: CRNA  Anesthesia Plan Comments:         Anesthesia Quick Evaluation

## 2017-05-06 NOTE — Op Note (Signed)
Procedure(s): LAPAROSCOPIC TUBAL LIGATION (BILATERAL SALPINGECTOMY) Procedure Note  Lauren Davis female 36 y.o. 05/06/2017  Indications: The patient is a 36 y.o. 252P2002 female who desires permanent sterility with bilateral salpingectomy.  Pre-operative Diagnosis: Multiparity, desiring permanent sterilization  Post-operative Diagnosis: Same   Surgeon: Hildred LaserAnika Phoenicia Pirie, MD  Assistants: Surgical scrub tech  Anesthesia: General endotracheal anesthesia   Procedure Details: The patient was seen in the Holding Room. The risks, benefits, complications, treatment options, and expected outcomes were discussed with the patient.  The patient concurred with the proposed plan, giving informed consent.  The site of surgery properly noted/marked. The patient was taken to the Operating Room, identified as Lauren Davis and the procedure verified as Procedure(s) (LRB): LAPAROSCOPIC TUBAL LIGATION (Bilateral Salpingectomy). A Time Out was held and the above information confirmed.  She was then placed under general anesthesia without difficulty. She was placed in the dorsal lithotomy position, and was prepped and draped in a sterile manner.  A straight catheterization was performed. A sterile speculum was inserted into the vagina and the cervix was grasped at the anterior lip using a single-toothed tenaculum.  A Hulka clamp was placed for uterine manipulation.  The speculum and tenaculum were then removed. After an adequate timeout was performed, attention was turned to the abdomen where an umbilical incision was made with the scalpel.  The Optiview 5-mm trocar and sleeve were then advanced without difficulty with the laparoscope under direct visualization into the abdomen.  The abdomen was then insufflated with carbon dioxide gas and adequate pneumoperitoneum was obtained. A 5-mm left lower quadrant port and an 5-mm right lower quadrant port were then placed under direct visualization.  A survey of the  patient's pelvis and abdomen revealed the findings as above.  Perforation of the uterus was noted from the use of the Hulka clamp. The Hulka clamps was readjusted, with no bleeding noted from the perforation site. The fallopian tubes were transected from the uterine attachments and the underlying mesosalpinx with the Harmonic device allowing for bilateral salpingectomy.  The fallopian tubes were then removed from the abdomen under direct visualization.  The operative site was surveyed, and it was found to be hemostatic.   No intraoperative injury to other surrounding organs was noted.  The abdomen was desufflated and all instruments were then removed from the patient's abdomen.  All skin incisions were closed with Dermabond.  The uterine manipulator was removed from the cervix without complications. The patient tolerated the procedure well.  Sponge, lap, and needle counts were correct times two.  The patient was then taken to the recovery room awake, extubated and in stable condition.  The patient will be discharged to home as per PACU criteria.  Routine postoperative instructions given.  She was prescribed Percocet, Ibuprofen, Simethicone, and Colace.  She will follow up in the clinic on 1-2 weeks for postoperative evaluation.  Findings: Normal uterus, fallopian tubes, and ovaries. Uterine perforation from Hulka clamp manipulation, hemostatic.   Estimated Blood Loss:  Minimal (5 ml)      Drains: straight catheterization prior to procedure with 30 ml of clear urine         Total IV Fluids: 500 ml  Specimens: Bilateral fallopian tubes         Implants: None         Complications:  None; patient tolerated the procedure well.         Disposition: PACU - hemodynamically stable.         Condition: stable  Rubie Maid, MD [facility]

## 2017-05-06 NOTE — H&P (Signed)
UPDATE TO PREVIOUS HISTORY AND PHYSICAL  The patient has been seen and examined.  H&P is up to date, no changes noted. Lauren Davis is a 36 y.o. G2P1002 here as she desires permanent sterility with tubal ligation (laparoscopic bilateral salpingectomy). No significant preoperative concerns.  Patient can proceed to the OR for scheduled procedure.   [name], [name], MD 05/06/2017 7:27 AM

## 2017-05-06 NOTE — Anesthesia Procedure Notes (Signed)
Procedure Name: Intubation Date/Time: 05/06/2017 7:44 AM Performed by: Zetta Bills, CRNA Pre-anesthesia Checklist: Patient identified, Patient being monitored, Timeout performed, Emergency Drugs available and Suction available Patient Re-evaluated:Patient Re-evaluated prior to induction Oxygen Delivery Method: Circle system utilized Preoxygenation: Pre-oxygenation with 100% oxygen Induction Type: IV induction Ventilation: Mask ventilation without difficulty Laryngoscope Size: Mac and 3 Grade View: Grade I Tube type: Oral Tube size: 7.0 mm Number of attempts: 1 Airway Equipment and Method: Stylet Placement Confirmation: ETT inserted through vocal cords under direct vision,  positive ETCO2 and breath sounds checked- equal and bilateral Secured at: 21 cm Tube secured with: Tape Dental Injury: Teeth and Oropharynx as per pre-operative assessment

## 2017-05-06 NOTE — Anesthesia Post-op Follow-up Note (Signed)
Anesthesia QCDR form completed.        

## 2017-05-06 NOTE — Transfer of Care (Signed)
Immediate Anesthesia Transfer of Care Note  Patient: Lauren Davis  Procedure(s) Performed: LAPAROSCOPIC TUBAL LIGATION (Bilateral Abdomen)  Patient Location: PACU  Anesthesia Type:General  Level of Consciousness: awake  Airway & Oxygen Therapy: Patient Spontanous Breathing  Post-op Assessment: Report given to RN  Post vital signs: stable  Last Vitals:  Vitals:   05/06/17 0619 05/06/17 0855  BP: 117/62 123/71  Pulse: 88 86  Resp: 20 20  Temp: 36.6 C (!) 36.3 C  SpO2: 100% 100%    Last Pain:  Vitals:   05/06/17 0855  TempSrc:   PainSc: Asleep         Complications: No apparent anesthesia complications

## 2017-05-07 LAB — SURGICAL PATHOLOGY

## 2017-05-17 ENCOUNTER — Encounter: Payer: Self-pay | Admitting: Obstetrics and Gynecology

## 2017-05-17 ENCOUNTER — Ambulatory Visit (INDEPENDENT_AMBULATORY_CARE_PROVIDER_SITE_OTHER): Payer: 59 | Admitting: Obstetrics and Gynecology

## 2017-05-17 VITALS — BP 112/71 | HR 87 | Ht 68.0 in | Wt 153.0 lb

## 2017-05-17 DIAGNOSIS — Z9851 Tubal ligation status: Secondary | ICD-10-CM

## 2017-05-17 DIAGNOSIS — Z4889 Encounter for other specified surgical aftercare: Secondary | ICD-10-CM

## 2017-05-17 NOTE — Progress Notes (Signed)
    OBSTETRICS/GYNECOLOGY POST-OPERATIVE CLINIC VISIT  Subjective:     Lauren Davis is a 36 y.o. 362P1002 female who presents to the clinic 10 days status post laparoscopic bilateral salpingectomy for requested sterilization. Eating a regular diet without difficulty. Bowel movements are normal. The patient is not having any pain.  The following portions of the patient's history were reviewed and updated as appropriate: allergies, current medications, past family history, past medical history, past social history, past surgical history and problem list.  Review of Systems Pertinent items noted in HPI and remainder of comprehensive ROS otherwise negative.    Objective:    BP 112/71 (BP Location: Left Arm, Patient Position: Sitting, Cuff Size: Normal)   Pulse 87   Ht 5\' 8"  (1.727 m)   Wt 153 lb (69.4 kg)   LMP  (LMP Unknown)   BMI 23.26 kg/m  General:  alert and no distress  Abdomen: soft, bowel sounds active, non-tender  Incision:   healing well, no drainage, no erythema, no hernia, no seroma, no swelling, no dehiscence, incision well approximated except umbilical, with skin separation    Pathology:   A. FALLOPIAN TUBE, BILATERAL; STERILIZATION:  - TWO FALLOPIAN TUBES WITH FIMBRIATED END.  - NEGATIVE FOR ATYPIA AND MALIGNANCY.    Assessment:    Doing well postoperatively.  S/p bilateral salpingectomy.  Plan:   1. Continue any current medications, as needed. 2. Wound care discussed. Steri-strip placed on umbilical incision.  3. Operative findings again reviewed. Pathology report discussed. 4. Activity restrictions: no bending, stooping, or squatting and no lifting more than 10 pounds 5. Anticipated return to work: 1-2 weeks. 6. Follow up: as needed.    [name], Lauren Spanier, MD [facility]

## 2017-05-29 DIAGNOSIS — H52222 Regular astigmatism, left eye: Secondary | ICD-10-CM | POA: Diagnosis not present

## 2017-05-29 DIAGNOSIS — H5213 Myopia, bilateral: Secondary | ICD-10-CM | POA: Diagnosis not present

## 2017-07-09 DIAGNOSIS — L570 Actinic keratosis: Secondary | ICD-10-CM | POA: Diagnosis not present

## 2017-07-11 DIAGNOSIS — L282 Other prurigo: Secondary | ICD-10-CM | POA: Diagnosis not present

## 2017-07-11 DIAGNOSIS — L710 Perioral dermatitis: Secondary | ICD-10-CM | POA: Diagnosis not present

## 2017-09-02 DIAGNOSIS — L570 Actinic keratosis: Secondary | ICD-10-CM | POA: Diagnosis not present

## 2017-10-15 ENCOUNTER — Other Ambulatory Visit: Payer: Self-pay | Admitting: *Deleted

## 2017-10-15 MED ORDER — ACYCLOVIR 400 MG PO TABS: 400.0000 mg | ORAL_TABLET | Freq: Two times a day (BID) | ORAL | 4 refills | Status: DC | PRN

## 2017-10-17 ENCOUNTER — Ambulatory Visit (INDEPENDENT_AMBULATORY_CARE_PROVIDER_SITE_OTHER): Payer: 59 | Admitting: Family Medicine

## 2017-10-17 ENCOUNTER — Encounter: Payer: Self-pay | Admitting: Family Medicine

## 2017-10-17 VITALS — BP 112/70 | HR 85 | Temp 98.1°F | Resp 16 | Ht 68.0 in | Wt 156.0 lb

## 2017-10-17 DIAGNOSIS — A6000 Herpesviral infection of urogenital system, unspecified: Secondary | ICD-10-CM | POA: Diagnosis not present

## 2017-10-17 DIAGNOSIS — Z8632 Personal history of gestational diabetes: Secondary | ICD-10-CM

## 2017-10-17 DIAGNOSIS — Z8679 Personal history of other diseases of the circulatory system: Secondary | ICD-10-CM | POA: Insufficient documentation

## 2017-10-17 DIAGNOSIS — Z8742 Personal history of other diseases of the female genital tract: Secondary | ICD-10-CM

## 2017-10-17 DIAGNOSIS — Z87898 Personal history of other specified conditions: Secondary | ICD-10-CM | POA: Diagnosis not present

## 2017-10-17 NOTE — Assessment & Plan Note (Signed)
Managed by Gynecology Continue Acyclovir prn Due to infrequent outbreaks, does not need daily prophylaxis

## 2017-10-17 NOTE — Patient Instructions (Signed)
Preventive Care 18-39 Years, Female Preventive care refers to lifestyle choices and visits with your health care provider that can promote health and wellness. What does preventive care include?  A yearly physical exam. This is also called an annual well check.  Dental exams once or twice a year.  Routine eye exams. Ask your health care provider how often you should have your eyes checked.  Personal lifestyle choices, including: ? Daily care of your teeth and gums. ? Regular physical activity. ? Eating a healthy diet. ? Avoiding tobacco and drug use. ? Limiting alcohol use. ? Practicing safe sex. ? Taking vitamin and mineral supplements as recommended by your health care provider. What happens during an annual well check? The services and screenings done by your health care provider during your annual well check will depend on your age, overall health, lifestyle risk factors, and family history of disease. Counseling Your health care provider may ask you questions about your:  Alcohol use.  Tobacco use.  Drug use.  Emotional well-being.  Home and relationship well-being.  Sexual activity.  Eating habits.  Work and work Statistician.  Method of birth control.  Menstrual cycle.  Pregnancy history.  Screening You may have the following tests or measurements:  Height, weight, and BMI.  Diabetes screening. This is done by checking your blood sugar (glucose) after you have not eaten for a while (fasting).  Blood pressure.  Lipid and cholesterol levels. These may be checked every 5 years starting at age 66.  Skin check.  Hepatitis C blood test.  Hepatitis B blood test.  Sexually transmitted disease (STD) testing.  BRCA-related cancer screening. This may be done if you have a family history of breast, ovarian, tubal, or peritoneal cancers.  Pelvic exam and Pap test. This may be done every 3 years starting at age 40. Starting at age 59, this may be done every 5  years if you have a Pap test in combination with an HPV test.  Discuss your test results, treatment options, and if necessary, the need for more tests with your health care provider. Vaccines Your health care provider may recommend certain vaccines, such as:  Influenza vaccine. This is recommended every year.  Tetanus, diphtheria, and acellular pertussis (Tdap, Td) vaccine. You may need a Td booster every 10 years.  Varicella vaccine. You may need this if you have not been vaccinated.  HPV vaccine. If you are 69 or younger, you may need three doses over 6 months.  Measles, mumps, and rubella (MMR) vaccine. You may need at least one dose of MMR. You may also need a second dose.  Pneumococcal 13-valent conjugate (PCV13) vaccine. You may need this if you have certain conditions and were not previously vaccinated.  Pneumococcal polysaccharide (PPSV23) vaccine. You may need one or two doses if you smoke cigarettes or if you have certain conditions.  Meningococcal vaccine. One dose is recommended if you are age 27-21 years and a first-year college student living in a residence hall, or if you have one of several medical conditions. You may also need additional booster doses.  Hepatitis A vaccine. You may need this if you have certain conditions or if you travel or work in places where you may be exposed to hepatitis A.  Hepatitis B vaccine. You may need this if you have certain conditions or if you travel or work in places where you may be exposed to hepatitis B.  Haemophilus influenzae type b (Hib) vaccine. You may need this if  you have certain risk factors.  Talk to your health care provider about which screenings and vaccines you need and how often you need them. This information is not intended to replace advice given to you by your health care provider. Make sure you discuss any questions you have with your health care provider. Document Released: 07/03/2001 Document Revised: 01/25/2016  Document Reviewed: 03/08/2015 Elsevier Interactive Patient Education  [employer].

## 2017-10-17 NOTE — Assessment & Plan Note (Addendum)
Managed by GYN Updated HM with most recent pap - 2017

## 2017-10-17 NOTE — Assessment & Plan Note (Signed)
Doing well, asymptomatic Not currently on any medications In NSR on exam today Continue to monitor Could use Metoprolol again in the future if needed

## 2017-10-17 NOTE — Assessment & Plan Note (Signed)
No signs of T2DM Continue to monitor annual A1c Reviewed last A1c

## 2017-10-17 NOTE — Progress Notes (Signed)
Patient: Lauren Davis, Female    DOB: [birth year], 37 y.o.   MRN: [id number] Visit Date: 10/17/2017  Today's Provider: Lavon Paganini, MD   I, Martha Clan, CMA, am acting as scribe for Lauren Paganini, MD.  Chief Complaint  Patient presents with  . New Patient (Initial Visit)   Subjective:    Lauren Davis is a 37 y.o. female who presents today to establish care. She sees [facility] for her GYN needs. She feels well. She reports exercising 4-5 days per week. Runs, arm/leg weight training. She reports she is sleeping fairly well.  She is c/o skin changes. She states it takes longer for bug bites, etc to heal.   Patient is travelling to [place] in the fall with her daughter and her mother in the fall.  She was concerned that she might not be immune to measles.  She had titers drawn at work and she is immune to all components of MMR.  H/o abnormal paps and HPV positive. No h/o colpo, biopsy, LEEP, cryo.  Repeat paps have returned normal.  Per chart review, last pap was in 2017 and was normal.  H/o diet controlled GDM.  Blood sugars have been normal since then.  Has never needed to take a medication for this.  H/o irregular heartbeat.  Was evaluated by Dr. Saralyn Pilar.  Normal stress test.  Something (unclear what) was seen on Echo.  Took Metoprolol for palpitations for about 6 months and hasn't needed it since.  This was during a stressful time in life. -----------------------------------------------------------------   Review of Systems  Constitutional: Negative.   HENT: Negative.   Eyes: Negative.   Respiratory: Negative.   Cardiovascular: Negative.   Gastrointestinal: Negative.   Endocrine: Negative.   Genitourinary: Negative.   Musculoskeletal: Negative.   Skin: Negative.   Allergic/Immunologic: Negative.   Neurological: Positive for dizziness and light-headedness. Negative for tremors, seizures, syncope, facial asymmetry, speech difficulty,  weakness, numbness and headaches.  Hematological: Negative.   Psychiatric/Behavioral: Negative.     Social History      She  reports that she quit smoking about 9 years ago. Her smoking use included cigarettes. She quit after 8.00 years of use. She has never used smokeless tobacco. She reports that she drinks alcohol. She reports that she does not use drugs.       Social History   Socioeconomic History  . Marital status: Married    Spouse name: Lauren Davis  . Number of children: 2  . Years of education: 78  . Highest education level: Associate degree: academic program  Occupational History  . Occupation: Freight forwarder at [employer]  . Financial resource strain: Not on file  . Food insecurity:    Worry: Not on file    Inability: Not on file  . Transportation needs:    Medical: Not on file    Non-medical: Not on file  Tobacco Use  . Smoking status: Former Smoker    Years: 8.00    Types: Cigarettes    Last attempt to quit: 05/20/2008    Years since quitting: 9.4  . Smokeless tobacco: Never Used  . Tobacco comment: former social smoker  Substance and Sexual Activity  . Alcohol use: Yes    Alcohol/week: 0.0 oz    Comment: occasional  . Drug use: No  . Sexual activity: Yes    Partners: Male    Birth control/protection: Surgical  Lifestyle  . Physical activity:  Days per week: Not on file    Minutes per session: Not on file  . Stress: Not on file  Relationships  . Social connections:    Talks on phone: Not on file    Gets together: Not on file    Attends religious service: Not on file    Active member of club or organization: Not on file    Attends meetings of clubs or organizations: Not on file    Relationship status: Not on file  Other Topics Concern  . Not on file  Social History Narrative  . Not on file    Past Medical History:  Diagnosis Date  . Complication of anesthesia    SLOW TO WAKE UP  . Constipation   . Dysrhythmia    H/O  METOPROLOL 2 YEARS AGO  . Fatigue   . Gestational diabetes    Gestational Diabetes/ resolved  . HPV in female   . Vaginal Pap smear, abnormal      Patient Active Problem List   Diagnosis Date Noted  . 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 History of gestational diabetes 10/17/2017  . History of abnormal cervical Pap smear 10/17/2017  . History of cardiac dysrhythmia 10/17/2017    Past Surgical History:  Procedure Laterality Date  . LAPAROSCOPIC TUBAL LIGATION Bilateral 05/06/2017   Procedure: LAPAROSCOPIC TUBAL LIGATION;  Surgeon: Rubie Maid, MD;  Location: [facility] ORS;  Service: Gynecology;  Laterality: Bilateral;  . TONSILLECTOMY  2004    Family History        Family Status  Relation Name Status  . Mother  Alive  . MGM  Deceased  . MGF  Deceased  . PGM  (Not Specified)  . PGF  (Not Specified)  . Father  Alive  . Neg Hx  (Not Specified)        Her family history includes Diabetes in her maternal grandfather, maternal grandmother, and mother; Heart disease in her maternal grandmother, paternal grandfather, and paternal grandmother; Hyperlipidemia in her father and mother; Hypertension in her father; Non-Hodgkin's lymphoma in her father. There is no history of Colon cancer, Breast cancer, or Cervical cancer.      Allergies  Allergen Reactions  . Other Rash    Derma-bond   . Sulfa Antibiotics Rash     Current Outpatient Medications:  .  acyclovir (ZOVIRAX) 400 MG tablet, Take 1 tablet (400 mg total) by mouth 2 (two) times daily as needed (outbreaks)., Disp: 60 tablet, Rfl: 4 .  fexofenadine-pseudoephedrine (ALLEGRA-D 24) 180-240 MG 24 hr tablet, Take 1 tablet by mouth daily as needed (allergies)., Disp: , Rfl:    Patient Care Team: Virginia Crews, MD as PCP - General (Family Medicine)      Objective:   Vitals: BP 112/70 (BP Location: Left Arm, Patient Position: Sitting, Cuff Size: Normal)   Pulse 85   Temp 98.1 F (36.7 C) (Oral)   Resp 16   Ht '5\' 8"'   (1.727 m)   Wt 156 lb (70.8 kg)   SpO2 99%   BMI 23.72 kg/m    Vitals:   10/17/17 1413  BP: 112/70  Pulse: 85  Resp: 16  Temp: 98.1 F (36.7 C)  TempSrc: Oral  SpO2: 99%  Weight: 156 lb (70.8 kg)  Height: '5\' 8"'  (1.727 m)     Physical Exam  Constitutional: She is oriented to person, place, and time. She appears well-developed and well-nourished. No distress.  HENT:  Head: Normocephalic and atraumatic.  Right Ear: External ear  normal.  Left Ear: External ear normal.  Nose: Nose normal.  Mouth/Throat: Oropharynx is clear and moist.  Eyes: Pupils are equal, round, and reactive to light. Conjunctivae and EOM are normal. No scleral icterus.  Neck: Neck supple. No thyromegaly present.  Cardiovascular: Normal rate, regular rhythm, normal heart sounds and intact distal pulses.  No murmur heard. Pulmonary/Chest: Effort normal and breath sounds normal. No respiratory distress. She has no wheezes. She has no rales.  Abdominal: Soft. Bowel sounds are normal. She exhibits no distension. There is no tenderness. There is no rebound and no guarding.  Musculoskeletal: She exhibits no edema or deformity.  Lymphadenopathy:    She has no cervical adenopathy.  Neurological: She is alert and oriented to person, place, and time.  Skin: Skin is warm and dry. Capillary refill takes less than 2 seconds. No rash noted.  Psychiatric: She has a normal mood and affect. Her behavior is normal.  Vitals reviewed.    Depression Screen PHQ 2/9 Scores 10/17/2017 10/18/2015 07/04/2015  PHQ - 2 Score 0 0 0     Assessment & Plan:   Problem List Items Addressed This Visit      Genitourinary   Recurrent genital herpes    Managed by Gynecology Continue Acyclovir prn Due to infrequent outbreaks, does not need daily prophylaxis        Other   History of gestational diabetes - Primary    No signs of T2DM Continue to monitor annual A1c Reviewed last A1c      History of abnormal cervical Pap smear      Managed by GYN Updated HM with most recent pap - 2017      History of cardiac dysrhythmia    Doing well, asymptomatic Not currently on any medications In NSR on exam today Continue to monitor Could use Metoprolol again in the future if needed          Return in about 1 year (around 10/18/2018) for CPE.   The entirety of the information documented in the History of Present Illness, Review of Systems and Physical Exam were personally obtained by me. Portions of this information were initially documented by Raquel Sarna Ratchford, CMA and reviewed by me for thoroughness and accuracy.    Virginia Crews, MD, MPH [facility] 10/17/2017 3:14 PM

## 2018-04-10 ENCOUNTER — Ambulatory Visit (INDEPENDENT_AMBULATORY_CARE_PROVIDER_SITE_OTHER): Payer: 59 | Admitting: Obstetrics and Gynecology

## 2018-04-10 ENCOUNTER — Encounter: Payer: Self-pay | Admitting: Obstetrics and Gynecology

## 2018-04-10 VITALS — BP 104/78 | HR 91 | Ht 68.0 in | Wt 153.6 lb

## 2018-04-10 DIAGNOSIS — Z01419 Encounter for gynecological examination (general) (routine) without abnormal findings: Secondary | ICD-10-CM | POA: Diagnosis not present

## 2018-04-10 NOTE — Patient Instructions (Signed)
Preventive Care 18-39 Years, Female Preventive care refers to lifestyle choices and visits with your health care provider that can promote health and wellness. What does preventive care include?  A yearly physical exam. This is also called an annual well check.  Dental exams once or twice a year.  Routine eye exams. Ask your health care provider how often you should have your eyes checked.  Personal lifestyle choices, including: ? Daily care of your teeth and gums. ? Regular physical activity. ? Eating a healthy diet. ? Avoiding tobacco and drug use. ? Limiting alcohol use. ? Practicing safe sex. ? Taking vitamin and mineral supplements as recommended by your health care provider. What happens during an annual well check? The services and screenings done by your health care provider during your annual well check will depend on your age, overall health, lifestyle risk factors, and family history of disease. Counseling Your health care provider may ask you questions about your:  Alcohol use.  Tobacco use.  Drug use.  Emotional well-being.  Home and relationship well-being.  Sexual activity.  Eating habits.  Work and work Statistician.  Method of birth control.  Menstrual cycle.  Pregnancy history.  Screening You may have the following tests or measurements:  Height, weight, and BMI.  Diabetes screening. This is done by checking your blood sugar (glucose) after you have not eaten for a while (fasting).  Blood pressure.  Lipid and cholesterol levels. These may be checked every 5 years starting at age 38.  Skin check.  Hepatitis C blood test.  Hepatitis B blood test.  Sexually transmitted disease (STD) testing.  BRCA-related cancer screening. This may be done if you have a family history of breast, ovarian, tubal, or peritoneal cancers.  Pelvic exam and Pap test. This may be done every 3 years starting at age 38. Starting at age 30, this may be done  every 5 years if you have a Pap test in combination with an HPV test.  Discuss your test results, treatment options, and if necessary, the need for more tests with your health care provider. Vaccines Your health care provider may recommend certain vaccines, such as:  Influenza vaccine. This is recommended every year.  Tetanus, diphtheria, and acellular pertussis (Tdap, Td) vaccine. You may need a Td booster every 10 years.  Varicella vaccine. You may need this if you have not been vaccinated.  HPV vaccine. If you are 39 or younger, you may need three doses over 6 months.  Measles, mumps, and rubella (MMR) vaccine. You may need at least one dose of MMR. You may also need a second dose.  Pneumococcal 13-valent conjugate (PCV13) vaccine. You may need this if you have certain conditions and were not previously vaccinated.  Pneumococcal polysaccharide (PPSV23) vaccine. You may need one or two doses if you smoke cigarettes or if you have certain conditions.  Meningococcal vaccine. One dose is recommended if you are age 68-21 years and a first-year college student living in a residence hall, or if you have one of several medical conditions. You may also need additional booster doses.  Hepatitis A vaccine. You may need this if you have certain conditions or if you travel or work in places where you may be exposed to hepatitis A.  Hepatitis B vaccine. You may need this if you have certain conditions or if you travel or work in places where you may be exposed to hepatitis B.  Haemophilus influenzae type b (Hib) vaccine. You may need this  if you have certain risk factors.  Talk to your health care provider about which screenings and vaccines you need and how often you need them. This information is not intended to replace advice given to you by your health care provider. Make sure you discuss any questions you have with your health care provider. Document Released: 07/03/2001 Document Revised:  01/25/2016 Document Reviewed: 03/08/2015 Elsevier Interactive Patient Education  2018 Elsevier Inc.  

## 2018-04-10 NOTE — Progress Notes (Signed)
Subjective:   Lauren Davis is a 37 y.o. G33P1002 Caucasian female here for a routine well-woman exam.  No LMP recorded. (Menstrual status: Other).    Current complaints: none PCP: ?       does desire labs  Social History: Sexual: heterosexual Marital Status: married Living situation: with family Occupation: Scientist, physiological at [employer]. Tobacco/alcohol: alcohol intake:social drinker Illicit drugs: no history of illicit drug use  The following portions of the patient's history were reviewed and updated as appropriate: allergies, current medications, past family history, past medical history, past social history, past surgical history and problem list.  Past Medical History Past Medical History:  Diagnosis Date  . Complication of anesthesia    SLOW TO WAKE UP  . Constipation   . Dysrhythmia    H/O METOPROLOL 2 YEARS AGO  . Fatigue   . Gestational diabetes    Gestational Diabetes/ resolved  . HPV in female   . Vaginal Pap smear, abnormal     Past Surgical History Past Surgical History:  Procedure Laterality Date  . LAPAROSCOPIC TUBAL LIGATION Bilateral 05/06/2017   Procedure: LAPAROSCOPIC TUBAL LIGATION;  Surgeon: Hildred Laser, MD;  Location: [facility] ORS;  Service: Gynecology;  Laterality: Bilateral;  . TONSILLECTOMY  2004    Gynecologic History G2P1002  No LMP recorded. (Menstrual status: Other). Contraception: tubal ligation Last Pap: 2017. Results were: normal   Obstetric History OB History  Gravida Para Term Preterm AB Living  2 1 1     2   SAB TAB Ectopic Multiple Live Births        0 2    # Outcome Date GA Lbr Len/2nd Weight Sex Delivery Anes PTL Lv  2 Term 09/05/15 [redacted]w[redacted]d / 00:16 7 lb 12 oz (3.515 kg) F Vag-Spont EPI  LIV  1 Gravida 2010    F Vag-Spont   LIV    Current Medications Current Outpatient Medications on File Prior to Visit  Medication Sig Dispense Refill  . acyclovir (ZOVIRAX) 400 MG tablet Take 1 tablet (400 mg total) by mouth 2 (two)  times daily as needed (outbreaks). 60 tablet 4  . fexofenadine-pseudoephedrine (ALLEGRA-D 24) 180-240 MG 24 hr tablet Take 1 tablet by mouth daily as needed (allergies).     No current facility-administered medications on file prior to visit.     Review of Systems Patient denies any headaches, blurred vision, shortness of breath, chest pain, abdominal pain, problems with bowel movements, urination, or intercourse.  Objective:  BP 104/78   Pulse 91   Ht 5\' 8"  (1.727 m)   Wt 153 lb 9.6 oz (69.7 kg)   BMI 23.35 kg/m  Physical Exam  General:  Well developed, well nourished, no acute distress. She is alert and oriented x3. Skin:  Warm and dry Neck:  Midline trachea, no thyromegaly or nodules Cardiovascular: Regular rate and rhythm, no murmur heard Lungs:  Effort normal, all lung fields clear to auscultation bilaterally Breasts:  No dominant palpable mass, retraction, or nipple discharge Abdomen:  Soft, non tender, no hepatosplenomegaly or masses Pelvic:  External genitalia is normal in appearance.  The vagina is normal in appearance. The cervix is bulbous, no CMT.  Thin prep pap is not done. Uterus is felt to be normal size, shape, and contour.  No adnexal masses or tenderness noted. Extremities:  No swelling or varicosities noted Psych:  She has a normal mood and affect  Assessment:   Healthy well-woman exam  Plan:  Labs obtained will follow up accordingly  F/U 1 year for AE, or sooner if needed Mammogram baseline ordered  Melody Suzan NailerN Shambley, CNM

## 2018-04-11 LAB — COMPREHENSIVE METABOLIC PANEL
ALBUMIN: 4.2 g/dL (ref 3.5–5.5)
ALT: 23 IU/L (ref 0–32)
AST: 19 IU/L (ref 0–40)
Albumin/Globulin Ratio: 1.6 (ref 1.2–2.2)
Alkaline Phosphatase: 63 IU/L (ref 39–117)
BUN / CREAT RATIO: 10 (ref 9–23)
BUN: 8 mg/dL (ref 6–20)
Bilirubin Total: 0.5 mg/dL (ref 0.0–1.2)
CALCIUM: 9.4 mg/dL (ref 8.7–10.2)
CO2: 22 mmol/L (ref 20–29)
CREATININE: 0.81 mg/dL (ref 0.57–1.00)
Chloride: 104 mmol/L (ref 96–106)
GFR calc Af Amer: 107 mL/min/{1.73_m2} (ref >59)
GFR, EST NON AFRICAN AMERICAN: 93 mL/min/{1.73_m2} (ref >59)
Globulin, Total: 2.7 g/dL (ref 1.5–4.5)
Glucose: 92 mg/dL (ref 65–99)
Potassium: 4.7 mmol/L (ref 3.5–5.2)
SODIUM: 140 mmol/L (ref 134–144)
Total Protein: 6.9 g/dL (ref 6.0–8.5)

## 2018-04-11 LAB — LIPID PANEL
CHOLESTEROL TOTAL: 191 mg/dL (ref 100–199)
Chol/HDL Ratio: 3.4 ratio (ref 0.0–4.4)
HDL: 57 mg/dL (ref >39)
LDL Calculated: 120 mg/dL — ABNORMAL HIGH (ref 0–99)
Triglycerides: 68 mg/dL (ref 0–149)
VLDL Cholesterol Cal: 14 mg/dL (ref 5–40)

## 2018-04-11 LAB — HEMOGLOBIN A1C
ESTIMATED AVERAGE GLUCOSE: 105 mg/dL
Hgb A1c MFr Bld: 5.3 % (ref 4.8–5.6)

## 2018-04-24 ENCOUNTER — Ambulatory Visit
Admission: RE | Admit: 2018-04-24 | Discharge: 2018-04-24 | Disposition: A | Discharge status: Home / Self Care | Payer: 59 | Source: Ambulatory Visit | Attending: Obstetrics and Gynecology | Admitting: Obstetrics and Gynecology

## 2018-04-24 ENCOUNTER — Other Ambulatory Visit: Payer: Self-pay | Admitting: Obstetrics and Gynecology

## 2018-04-24 DIAGNOSIS — Z1231 Encounter for screening mammogram for malignant neoplasm of breast: Secondary | ICD-10-CM | POA: Diagnosis not present

## 2018-04-24 DIAGNOSIS — Z01419 Encounter for gynecological examination (general) (routine) without abnormal findings: Secondary | ICD-10-CM | POA: Insufficient documentation

## 2018-04-24 DIAGNOSIS — R928 Other abnormal and inconclusive findings on diagnostic imaging of breast: Secondary | ICD-10-CM

## 2018-04-24 DIAGNOSIS — N6489 Other specified disorders of breast: Secondary | ICD-10-CM

## 2018-04-29 ENCOUNTER — Other Ambulatory Visit: Payer: Self-pay | Admitting: Obstetrics and Gynecology

## 2018-05-05 ENCOUNTER — Ambulatory Visit
Admission: RE | Admit: 2018-05-05 | Discharge: 2018-05-05 | Disposition: A | Discharge status: Home / Self Care | Payer: 59 | Source: Ambulatory Visit | Attending: Obstetrics and Gynecology | Admitting: Obstetrics and Gynecology

## 2018-05-05 DIAGNOSIS — R928 Other abnormal and inconclusive findings on diagnostic imaging of breast: Secondary | ICD-10-CM | POA: Insufficient documentation

## 2018-05-05 DIAGNOSIS — N6489 Other specified disorders of breast: Secondary | ICD-10-CM

## 2018-05-26 ENCOUNTER — Encounter: Payer: Self-pay | Admitting: Family Medicine

## 2018-05-26 ENCOUNTER — Ambulatory Visit (INDEPENDENT_AMBULATORY_CARE_PROVIDER_SITE_OTHER): Payer: 59 | Admitting: Family Medicine

## 2018-05-26 VITALS — BP 131/78 | HR 73 | Temp 98.0°F | Wt 156.4 lb

## 2018-05-26 DIAGNOSIS — G43809 Other migraine, not intractable, without status migrainosus: Secondary | ICD-10-CM

## 2018-05-26 MED ORDER — AMITRIPTYLINE HCL 10 MG PO TABS: 10.0000 mg | ORAL_TABLET | Freq: Every day | ORAL | 2 refills | Status: DC

## 2018-05-26 NOTE — Progress Notes (Signed)
Patient: Lauren Davis Female    DOB: [birth year]   38 y.o.   MRN: [id number] Visit Date: 05/26/2018  Today's Provider: Shirlee Latch, MD   Chief Complaint  Patient presents with  . Dizziness   Subjective:    I, Presley Raddle, CMA, am acting as a scribe for Shirlee Latch, MD.   Dizziness  This is a new problem. The current episode started more than 1 year ago. Episode frequency: episodes. The problem has been gradually worsening (1-2 episodes per week ). Nothing aggravates the symptoms. She has tried nothing for the symptoms.  Patient reports when she has a dizzy episode it will last approximately 3-5 seconds then she feels really bad.   Patient states she has had dizzy episodes for more than 10 years intermittently.  She states she was evaluated by ENT previously and when needed inner ear testing was all negative.  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  Over the last 2 to 3 months she has been having 1-2 episodes per week.  Symptoms can occur at rest or with movement.  They do not occur with position changes.  She has not noticed anything that makes it better.  The episode will last a few seconds and then she will feel very fatigued and nauseous.  States this is similar to her migraines, but she does not have a headache at the time.  She denies any vision changes though she does have some photophobia.  She denies any hearing changes, palpitations, shortness of breath, flushing, fever.   Allergies  Allergen Reactions  . Other Rash    Derma-bond   . Sulfa Antibiotics Rash     Current Outpatient Medications:  .  acyclovir (ZOVIRAX) 400 MG tablet, Take 1 tablet (400 mg total) by mouth 2 (two) times daily as needed (outbreaks)., Disp: 60 tablet, Rfl: 4 .  fexofenadine-pseudoephedrine (ALLEGRA-D 24) 180-240 MG 24 hr tablet, Take 1 tablet by mouth daily as needed (allergies)., Disp: , Rfl:   Review of Systems  Constitutional: Negative.     Respiratory: Negative.   Cardiovascular: Negative.   Musculoskeletal: Negative.   Neurological: Positive for dizziness.    Social History   Tobacco Use  . Smoking status: Former Smoker    Years: 8.00    Types: Cigarettes    Last attempt to quit: 05/20/2008    Years since quitting: 10.0  . Smokeless tobacco: Never Used  . Tobacco comment: former social smoker  Substance Use Topics  . Alcohol use: Yes    Alcohol/week: 0.0 standard drinks    Comment: occasional      Objective:   BP 131/78 (BP Location: Right Arm, Patient Position: Sitting, Cuff Size: Normal)   Pulse 73   Temp 98 F (36.7 C) (Oral)   Wt 156 lb 6.4 oz (70.9 kg)   BMI 23.78 kg/m  Vitals:   05/26/18 1602  BP: 131/78  Pulse: 73  Temp: 98 F (36.7 C)  TempSrc: Oral  Weight: 156 lb 6.4 oz (70.9 kg)     Physical Exam Vitals signs reviewed.  Constitutional:      General: She is not in acute distress.    Appearance: Normal appearance.  HENT:     Head: Normocephalic and atraumatic.     Right Ear: Tympanic membrane, ear canal and external ear normal. There is no impacted cerumen.     Left Ear: Tympanic membrane, ear canal and external ear normal.  There is no impacted cerumen.     Nose: Nose normal. No congestion or rhinorrhea.     Mouth/Throat:     Mouth: Mucous membranes are moist.     Pharynx: Oropharynx is clear. No oropharyngeal exudate.  Eyes:     General: No scleral icterus.       Right eye: No discharge.        Left eye: No discharge.     Extraocular Movements: Extraocular movements intact.     Conjunctiva/sclera: Conjunctivae normal.     Pupils: Pupils are equal, round, and reactive to light.  Neck:     Musculoskeletal: Neck supple.  Cardiovascular:     Rate and Rhythm: Normal rate and regular rhythm.     Pulses: Normal pulses.     Heart sounds: Normal heart sounds. No murmur.  Pulmonary:     Effort: Pulmonary effort is normal. No respiratory distress.     Breath sounds: Normal breath  sounds. No wheezing or rhonchi.  Abdominal:     General: There is no distension.     Palpations: Abdomen is soft.     Tenderness: There is no abdominal tenderness.  Musculoskeletal:     Right lower leg: No edema.     Left lower leg: No edema.  Lymphadenopathy:     Cervical: No cervical adenopathy.  Skin:    General: Skin is warm and dry.     Capillary Refill: Capillary refill takes less than 2 seconds.     Findings: No rash.  Neurological:     General: No focal deficit present.     Mental Status: She is alert and oriented to person, place, and time.     Cranial Nerves: No cranial nerve deficit.     Sensory: No sensory deficit.     Motor: No weakness.     Gait: Gait normal.  Psychiatric:        Mood and Affect: Mood normal.        Behavior: Behavior normal.         Assessment & Plan   1. Migraine variant -Chronic problem, but now worsening - Dizziness spells do not seem to be related to orthostatics as they do not occur with position changes or BPPV as she is previously had inner ear testing and she does not have any recurrent symptoms with head movement or extraocular movements - Given the nausea and photophobia and fatigue after episodes, is possible that this is a migraine variant without headache -Advised to try nasal steroid to ensure there is no sinus congestion contributing to any inner ear dysfunction -We will treat with migraine prophylaxis with low-dose amitriptyline - Discussed importance of hydration and good quality sleep - Follow-up in 6 weeks and consider dose titration versus further testing -Could consider labs including CBC, CMP, TSH, vitamin levels in the future    Meds ordered this encounter  Medications  . amitriptyline (ELAVIL) 10 MG tablet    Sig: Take 1 tablet (10 mg total) by mouth at bedtime.    Dispense:  30 tablet    Refill:  2     Return in about 6 weeks (around 07/07/2018) for Dizziness f/u.   The entirety of the information  documented in the History of Present Illness, Review of Systems and Physical Exam were personally obtained by me. Portions of this information were initially documented by Presley RaddleNikki Walston, CMA and reviewed by me for thoroughness and accuracy.    Keshan Reha, Marzella SchleinAngela M, MD, MPH [facility]  05/26/2018 4:46 PM

## 2018-06-04 DIAGNOSIS — H5213 Myopia, bilateral: Secondary | ICD-10-CM | POA: Diagnosis not present

## 2018-07-09 ENCOUNTER — Ambulatory Visit (INDEPENDENT_AMBULATORY_CARE_PROVIDER_SITE_OTHER): Payer: 59 | Admitting: Family Medicine

## 2018-07-09 ENCOUNTER — Encounter: Payer: Self-pay | Admitting: Family Medicine

## 2018-07-09 VITALS — BP 120/83 | HR 85 | Temp 98.0°F | Wt 168.6 lb

## 2018-07-09 DIAGNOSIS — G43909 Migraine, unspecified, not intractable, without status migrainosus: Secondary | ICD-10-CM | POA: Insufficient documentation

## 2018-07-09 DIAGNOSIS — G43709 Chronic migraine without aura, not intractable, without status migrainosus: Secondary | ICD-10-CM | POA: Diagnosis not present

## 2018-07-09 DIAGNOSIS — IMO0002 Reserved for concepts with insufficient information to code with codable children: Secondary | ICD-10-CM

## 2018-07-09 NOTE — Patient Instructions (Signed)

## 2018-07-09 NOTE — Assessment & Plan Note (Signed)
Well controlled with botox injections and amitriptyline She will try taking amitriptyline in the mornings and let me know if this helps or if effectiveness is waning Could consider dose increase to 25mg  daily

## 2018-07-09 NOTE — Progress Notes (Signed)
Patient: Lauren Davis Female    DOB: 22-Jan-1981   37 y.o.   MRN: [id number] Visit Date: 07/09/2018  Today's Provider: Shirlee Latch, MD   Chief Complaint  Patient presents with  . Dizziness   Subjective:     HPI Dizziness:  Patient presents today for a 6 week follow up. Last OV was on 05/26/2018. Patient advised to try nasal steroid to ensure there is no sinus congestion contributing to any inner ear dysfunction, Start migraine prophylaxis with low-dose amitriptyline, and discussed the importance of hydration and good quality sleep. She reports good compliance with treatment plan. She states symptoms are none.   She gets regular botox and thinks that this keeps headaches away.  Dizziness (except occasional episodes that last only a few seconds) and N/V and worn out feeling have resolved.  She had symptoms back after missing a dose last week.   Allergies  Allergen Reactions  . Other Rash    Derma-bond   . Sulfa Antibiotics Rash     Current Outpatient Medications:  .  acyclovir (ZOVIRAX) 400 MG tablet, Take 1 tablet (400 mg total) by mouth 2 (two) times daily as needed (outbreaks)., Disp: 60 tablet, Rfl: 4 .  amitriptyline (ELAVIL) 10 MG tablet, Take 1 tablet (10 mg total) by mouth at bedtime., Disp: 30 tablet, Rfl: 2 .  fexofenadine-pseudoephedrine (ALLEGRA-D 24) 180-240 MG 24 hr tablet, Take 1 tablet by mouth daily as needed (allergies)., Disp: , Rfl:   Review of Systems  Constitutional: Negative.   Respiratory: Negative.   Cardiovascular: Negative.   Musculoskeletal: Negative.   Neurological: Positive for dizziness.    Social History   Tobacco Use  . Smoking status: Former Smoker    Years: 8.00    Types: Cigarettes    Last attempt to quit: 05/20/2008    Years since quitting: 10.1  . Smokeless tobacco: Never Used  . Tobacco comment: former social smoker  Substance Use Topics  . Alcohol use: Yes    Alcohol/week: 0.0 standard drinks    Comment:  occasional      Objective:   BP 120/83 (BP Location: Left Arm, Patient Position: Sitting, Cuff Size: Normal)   Pulse 85   Temp 98 F (36.7 C) (Oral)   Wt 168 lb 9.6 oz (76.5 kg)   LMP 07/01/2018 (Approximate)   SpO2 96%   BMI 25.64 kg/m  Vitals:   07/09/18 1604  BP: 120/83  Pulse: 85  Temp: 98 F (36.7 C)  TempSrc: Oral  SpO2: 96%  Weight: 168 lb 9.6 oz (76.5 kg)     Physical Exam Vitals signs reviewed.  Constitutional:      General: She is not in acute distress.    Appearance: Normal appearance. She is not diaphoretic.  HENT:     Head: Normocephalic and atraumatic.     Right Ear: External ear normal.     Left Ear: External ear normal.     Mouth/Throat:     Pharynx: Oropharynx is clear.  Eyes:     General: No scleral icterus.    Conjunctiva/sclera: Conjunctivae normal.  Neck:     Musculoskeletal: Neck supple.  Cardiovascular:     Rate and Rhythm: Normal rate and regular rhythm.     Pulses: Normal pulses.     Heart sounds: Normal heart sounds. No murmur.  Pulmonary:     Effort: Pulmonary effort is normal. No respiratory distress.     Breath sounds: Normal breath sounds. No wheezing  or rhonchi.  Lymphadenopathy:     Cervical: No cervical adenopathy.  Skin:    General: Skin is warm and dry.     Capillary Refill: Capillary refill takes less than 2 seconds.  Neurological:     Mental Status: She is alert and oriented to person, place, and time. Mental status is at baseline.  Psychiatric:        Mood and Affect: Mood normal.        Behavior: Behavior normal.         Assessment & Plan   Problem List Items Addressed This Visit      Cardiovascular and Mediastinum   Chronic migraine - Primary    Well controlled with botox injections and amitriptyline She will try taking amitriptyline in the mornings and let me know if this helps or if effectiveness is waning Could consider dose increase to 25mg  daily          Return in about 1 year (around  07/10/2019) for CPE.   The entirety of the information documented in the History of Present Illness, Review of Systems and Physical Exam were personally obtained by me. Portions of this information were initially documented by Presley Raddle, CMA and reviewed by me for thoroughness and accuracy.    Erasmo Downer, MD, MPH [facility] 07/09/2018 4:42 PM

## 2018-10-15 ENCOUNTER — Encounter: Payer: Self-pay | Admitting: Family Medicine

## 2018-10-16 MED ORDER — AMITRIPTYLINE HCL 10 MG PO TABS: 10.0000 mg | ORAL_TABLET | Freq: Every day | ORAL | 2 refills | Status: DC

## 2019-01-27 ENCOUNTER — Other Ambulatory Visit: Payer: Self-pay | Admitting: *Deleted

## 2019-01-27 MED ORDER — ACYCLOVIR 400 MG PO TABS: 400.0000 mg | ORAL_TABLET | Freq: Two times a day (BID) | ORAL | 4 refills | Status: DC | PRN

## 2019-04-23 ENCOUNTER — Encounter: Payer: 59 | Admitting: Obstetrics and Gynecology

## 2019-04-29 ENCOUNTER — Encounter: Payer: Self-pay | Admitting: Family Medicine

## 2019-04-30 ENCOUNTER — Ambulatory Visit (INDEPENDENT_AMBULATORY_CARE_PROVIDER_SITE_OTHER): Payer: 59 | Admitting: Family Medicine

## 2019-04-30 ENCOUNTER — Other Ambulatory Visit (HOSPITAL_COMMUNITY)
Admission: RE | Admit: 2019-04-30 | Discharge: 2019-04-30 | Disposition: A | Discharge status: Home / Self Care | Payer: 59 | Source: Ambulatory Visit | Attending: Family Medicine | Admitting: Family Medicine

## 2019-04-30 ENCOUNTER — Other Ambulatory Visit: Payer: Self-pay

## 2019-04-30 ENCOUNTER — Encounter: Payer: Self-pay | Admitting: Family Medicine

## 2019-04-30 VITALS — BP 114/77 | HR 97 | Temp 97.5°F | Resp 16 | Ht 69.0 in | Wt 162.0 lb

## 2019-04-30 DIAGNOSIS — Z8632 Personal history of gestational diabetes: Secondary | ICD-10-CM

## 2019-04-30 DIAGNOSIS — Z0000 Encounter for general adult medical examination without abnormal findings: Secondary | ICD-10-CM | POA: Diagnosis not present

## 2019-04-30 DIAGNOSIS — G43709 Chronic migraine without aura, not intractable, without status migrainosus: Secondary | ICD-10-CM | POA: Diagnosis not present

## 2019-04-30 DIAGNOSIS — Z124 Encounter for screening for malignant neoplasm of cervix: Secondary | ICD-10-CM | POA: Insufficient documentation

## 2019-04-30 DIAGNOSIS — Z8742 Personal history of other diseases of the female genital tract: Secondary | ICD-10-CM

## 2019-04-30 DIAGNOSIS — IMO0002 Reserved for concepts with insufficient information to code with codable children: Secondary | ICD-10-CM

## 2019-04-30 MED ORDER — AMITRIPTYLINE HCL 10 MG PO TABS: 10.0000 mg | ORAL_TABLET | Freq: Every day | ORAL | 3 refills | Status: DC

## 2019-04-30 NOTE — Assessment & Plan Note (Signed)
Well-controlled Botox injections and amitriptyline Continue amitriptyline Refill given today

## 2019-04-30 NOTE — Progress Notes (Signed)
Patient: Lauren Davis, Female    DOB: [birth year], 38 y.o.   MRN: [id number] Visit Date: 04/30/2019  Today's Provider: Lavon Paganini, MD   Chief Complaint  Patient presents with  . Annual Exam   Subjective:    I [place] S. Dimas, CMA, am acting as scribe for Lavon Paganini, MD.   Annual physical exam Lauren Davis is a 38 y.o. female who presents today for health maintenance and complete physical. She feels well. She reports exercising no. She reports she is sleeping well.  03/22/2016 Pap/HPV-negative - does have h/o abnormal pap smears previously per report, however. 05/05/2018 Mammogram-No focal abnormality over the upper right breast. Repeat at age 66 -----------------------------------------------------------------   Review of Systems  Constitutional: Negative.   HENT: Negative.   Eyes: Negative.   Respiratory: Negative.   Cardiovascular: Negative.   Gastrointestinal: Negative.   Endocrine: Negative.   Genitourinary: Negative.   Musculoskeletal: Negative.   Skin: Negative.   Allergic/Immunologic: Negative.   Neurological: Negative.   Hematological: Negative.   Psychiatric/Behavioral: Negative.     Social History      She  reports that she quit smoking about 10 years ago. Her smoking use included cigarettes. She quit after 8.00 years of use. She has never used smokeless tobacco. She reports current alcohol use. She reports that she does not use drugs.       Social History   Socioeconomic History  . Marital status: Married    Spouse name: Leeza Heiner  . Number of children: 2  . Years of education: 46  . Highest education level: Associate degree: academic program  Occupational History  . Occupation: Freight forwarder at [employer]  Tobacco Use  . Smoking status: Former Smoker    Years: 8.00    Types: Cigarettes    Quit date: 05/20/2008    Years since quitting: 10.9  . Smokeless tobacco: Never Used  . Tobacco comment: former social smoker   Substance and Sexual Activity  . Alcohol use: Yes    Alcohol/week: 0.0 standard drinks    Comment: occasional  . Drug use: No  . Sexual activity: Yes    Partners: Male    Birth control/protection: Surgical  Other Topics Concern  . Not on file  Social History Narrative  . Not on file   Social Determinants of Health   Financial Resource Strain:   . Difficulty of Paying Living Expenses: Not on file  Food Insecurity:   . Worried About Charity fundraiser in the Last Year: Not on file  . Ran Out of Food in the Last Year: Not on file  Transportation Needs:   . Lack of Transportation (Medical): Not on file  . Lack of Transportation (Non-Medical): Not on file  Physical Activity:   . Days of Exercise per Week: Not on file  . Minutes of Exercise per Session: Not on file  Stress:   . Feeling of Stress : Not on file  Social Connections:   . Frequency of Communication with Friends and Family: Not on file  . Frequency of Social Gatherings with Friends and Family: Not on file  . Attends Religious Services: Not on file  . Active Member of Clubs or Organizations: Not on file  . Attends Archivist Meetings: Not on file  . Marital Status: Not on file    Past Medical History:  Diagnosis Date  . Complication of anesthesia    SLOW TO WAKE UP  .  Constipation   . Dysrhythmia    H/O METOPROLOL 2 YEARS AGO  . Fatigue   . Gestational diabetes    Gestational Diabetes/ resolved  . HPV in female   . Vaginal Pap smear, abnormal      Patient Active Problem List   Diagnosis Date Noted  . Chronic migraine 07/09/2018  . 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 History of gestational diabetes 10/17/2017  . History of abnormal cervical Pap smear 10/17/2017  . History of cardiac dysrhythmia 10/17/2017    Past Surgical History:  Procedure Laterality Date  . LAPAROSCOPIC TUBAL LIGATION Bilateral 05/06/2017   Procedure: LAPAROSCOPIC TUBAL LIGATION;  Surgeon: Hildred Laser, MD;   Location: [facility] ORS;  Service: Gynecology;  Laterality: Bilateral;  . TONSILLECTOMY  2004    Family History        Family Status  Relation Name Status  . Mother  Alive  . MGM  Deceased  . MGF  Deceased  . PGM  (Not Specified)  . PGF  (Not Specified)  . Father  Alive  . Brother  Alive  . Daughter  Alive  . Daughter  Alive  . Neg Hx  (Not Specified)        Her family history includes Diabetes in her maternal grandfather, maternal grandmother, and mother; Heart disease in her maternal grandmother, paternal grandfather, and paternal grandmother; Hyperlipidemia in her father and mother; Hypertension in her brother and father; Non-Hodgkin's lymphoma in her father. There is no history of Colon cancer, Breast cancer, or Cervical cancer.      Allergies  Allergen Reactions  . Other Rash    Derma-bond   . Sulfa Antibiotics Rash     Current Outpatient Medications:  .  amitriptyline (ELAVIL) 10 MG tablet, Take 1 tablet (10 mg total) by mouth at bedtime., Disp: 30 tablet, Rfl: 2 .  fexofenadine-pseudoephedrine (ALLEGRA-D 24) 180-240 MG 24 hr tablet, Take 1 tablet by mouth daily as needed (allergies)., Disp: , Rfl:  .  acyclovir (ZOVIRAX) 400 MG tablet, Take 1 tablet (400 mg total) by mouth 2 (two) times daily as needed (outbreaks). (Patient not taking: Reported on 04/30/2019), Disp: 60 tablet, Rfl: 4   Patient Care Team: Erasmo Downer, MD as PCP - General (Family Medicine)    Objective:    Vitals: BP 114/77 (BP Location: Left Arm, Patient Position: Sitting, Cuff Size: Normal)   Pulse 97   Temp (!) 97.5 F (36.4 C) (Temporal)   Resp 16   Ht  (1.753 m)   Wt 162 lb (73.5 kg)   LMP 04/15/2019 (Exact Date)   BMI 23.92 kg/m    Vitals:   04/30/19 1333  BP: 114/77  Pulse: 97  Resp: 16  Temp: (!) 97.5 F (36.4 C)  TempSrc: Temporal  Weight: 162 lb (73.5 kg)  Height:  (1.753 m)     Physical Exam Vitals reviewed.  Constitutional:      General: She is not in  acute distress.    Appearance: Normal appearance. She is well-developed. She is not diaphoretic.  HENT:     Head: Normocephalic and atraumatic.     Right Ear: Tympanic membrane, ear canal and external ear normal.     Left Ear: Tympanic membrane, ear canal and external ear normal.  Eyes:     General: No scleral icterus.    Conjunctiva/sclera: Conjunctivae normal.     Pupils: Pupils are equal, round, and reactive to light.  Neck:     Thyroid: No thyromegaly.  Cardiovascular:     Rate and Rhythm: Normal rate and regular rhythm.     Pulses: Normal pulses.     Heart sounds: Normal heart sounds. No murmur.  Pulmonary:     Effort: Pulmonary effort is normal. No respiratory distress.     Breath sounds: Normal breath sounds. No wheezing or rales.  Abdominal:     General: There is no distension.     Palpations: Abdomen is soft.     Tenderness: There is no abdominal tenderness.  Genitourinary:    Comments: Breasts: breasts appear normal, no suspicious masses, no skin or nipple changes or axillary nodes.  GYN:  External genitalia within normal limits.  Vaginal mucosa pink, moist, normal rugae.  Nonfriable cervix without lesions, no discharge or bleeding noted on speculum exam.  Bimanual exam revealed normal, nongravid uterus.  No cervical motion tenderness. No adnexal masses bilaterally.     Musculoskeletal:        General: No deformity.     Cervical back: Neck supple.     Right lower leg: No edema.     Left lower leg: No edema.  Lymphadenopathy:     Cervical: No cervical adenopathy.  Skin:    General: Skin is warm and dry.     Capillary Refill: Capillary refill takes less than 2 seconds.     Findings: No rash.  Neurological:     Mental Status: She is alert and oriented to person, place, and time. Mental status is at baseline.  Psychiatric:        Mood and Affect: Mood normal.        Behavior: Behavior normal.        Thought Content: Thought content normal.      Depression Screen  PHQ 2/9 Scores 04/30/2019 09/11/2018 10/17/2017 10/18/2015  PHQ - 2 Score 0 0 0 0  PHQ- 9 Score 0 - - -       Assessment & Plan:     Routine Health Maintenance and Physical Exam  Exercise Activities and Dietary recommendations Goals   None     Immunization History  Administered Date(s) Administered  . Influenza,inj,Quad PF,6+ Mos 02/26/2017  . Tdap 06/21/2015    Health Maintenance  Topic Date Due  . INFLUENZA VACCINE  12/20/2018  . PAP SMEAR-Modifier  03/23/2019  . TETANUS/TDAP  06/20/2025  . HIV Screening  Completed     Discussed health benefits of physical activity, and encouraged her to engage in regular exercise appropriate for her age and condition.    --------------------------------------------------------------------  Problem List Items Addressed This Visit      Cardiovascular and Mediastinum   Chronic migraine    Well-controlled Botox injections and amitriptyline Continue amitriptyline Refill given today      Relevant Medications   amitriptyline (ELAVIL) 10 MG tablet     Other   History of gestational diabetes    No symptoms of T2DM Continue to monitor annual A1c      Relevant Orders   Hemoglobin A1c   History of abnormal cervical Pap smear    Previously managed by GYN Repeat Pap smear today If normal and HPV negative, will follow the 5 years schedule per guidelines      Relevant Orders   Cytology - PAP    Other Visit Diagnoses    Annual physical exam    -  Primary   Relevant Orders   CBC with Differential/Platelet   Comprehensive metabolic panel   Lipid Panel With LDL/HDL Ratio   Hemoglobin  A1c   Screening for cervical cancer       Relevant Orders   Cytology - PAP       Return in about 1 year (around 04/29/2020) for CPE.   The entirety of the information documented in the History of Present Illness, Review of Systems and Physical Exam were personally obtained by me. Portions of this information were initially documented by  Rondel Baton, CMA and reviewed by me for thoroughness and accuracy.    Alley Neils, Marzella Schlein, MD MPH [facility]

## 2019-04-30 NOTE — Patient Instructions (Signed)
Preventive Care 38 Years Old Old, Female Preventive care refers to visits with your health care provider and lifestyle choices that can promote health and wellness. This includes:  A yearly physical exam. This may also be called an annual well check.  Regular dental visits and eye exams.  Immunizations.  Screening for certain conditions.  Healthy lifestyle choices, such as eating a healthy diet, getting regular exercise, not using drugs or products that contain nicotine and tobacco, and limiting alcohol use. What can I expect for my preventive care visit? Physical exam Your health care provider will check your:  Height and weight. This may be used to calculate body mass index (BMI), which tells if you are at a healthy weight.  Heart rate and blood pressure.  Skin for abnormal spots. Counseling Your health care provider may ask you questions about your:  Alcohol, tobacco, and drug use.  Emotional well-being.  Home and relationship well-being.  Sexual activity.  Eating habits.  Work and work environment.  Method of birth control.  Menstrual cycle.  Pregnancy history. What immunizations do I need?  Influenza (flu) vaccine  This is recommended every year. Tetanus, diphtheria, and pertussis (Tdap) vaccine  You may need a Td booster every 10 years. Varicella (chickenpox) vaccine  You may need this if you have not been vaccinated. Human papillomavirus (HPV) vaccine  If recommended by your health care provider, you may need three doses over 6 months. Measles, mumps, and rubella (MMR) vaccine  You may need at least one dose of MMR. You may also need a second dose. Meningococcal conjugate (MenACWY) vaccine  One dose is recommended if you are age 38 years and a first-year college student living in a residence hall, or if you have one of several medical conditions. You may also need additional booster doses. Pneumococcal conjugate (PCV13) vaccine  You may need  this if you have certain conditions and were not previously vaccinated. Pneumococcal polysaccharide (PPSV23) vaccine  You may need one or two doses if you smoke cigarettes or if you have certain conditions. Hepatitis A vaccine  You may need this if you have certain conditions or if you travel or work in places where you may be exposed to hepatitis A. Hepatitis B vaccine  You may need this if you have certain conditions or if you travel or work in places where you may be exposed to hepatitis B. Haemophilus influenzae type b (Hib) vaccine  You may need this if you have certain conditions. You may receive vaccines as individual doses or as more than one vaccine together in one shot (combination vaccines). Talk with your health care provider about the risks and benefits of combination vaccines. What tests do I need?  Blood tests  Lipid and cholesterol levels. These may be checked every 5 years starting at age 38.  Hepatitis C test.  Hepatitis B test. Screening  Diabetes screening. This is done by checking your blood sugar (glucose) after you have not eaten for a while (fasting).  Sexually transmitted disease (STD) testing.  BRCA-related cancer screening. This may be done if you have a family history of breast, ovarian, tubal, or peritoneal cancers.  Pelvic exam and Pap test. This may be done every 3 years starting at age 38. Starting at age 38, this may be done every 5 years if you have a Pap test in combination with an HPV test. Talk with your health care provider about your test results, treatment options, and if necessary, the need for more tests.   Follow these instructions at home: Eating and drinking   Eat a diet that includes fresh fruits and vegetables, whole grains, lean protein, and low-fat dairy.  Take vitamin and mineral supplements as recommended by your health care provider.  Do not drink alcohol if: ? Your health care provider tells you not to drink. ? You are  pregnant, may be pregnant, or are planning to become pregnant.  If you drink alcohol: ? Limit how much you have to 0-1 drink a day. ? Be aware of how much alcohol is in your drink. In the [place] one drink equals one 12 oz bottle of beer (355 mL), one 5 oz glass of wine (148 mL), or one 1 oz glass of hard liquor (44 mL). Lifestyle  Take daily care of your teeth and gums.  Stay active. Exercise for at least 30 minutes on 5 or more days each week.  Do not use any products that contain nicotine or tobacco, such as cigarettes, e-cigarettes, and chewing tobacco. If you need help quitting, ask your health care provider.  If you are sexually active, practice safe sex. Use a condom or other form of birth control (contraception) in order to prevent pregnancy and STIs (sexually transmitted infections). If you plan to become pregnant, see your health care provider for a preconception visit. What's next?  Visit your health care provider once a year for a well check visit.  Ask your health care provider how often you should have your eyes and teeth checked.  Stay up to date on all vaccines. This information is not intended to replace advice given to you by your health care provider. Make sure you discuss any questions you have with your health care provider. Document Released: 07/03/2001 Document Revised: 01/16/2018 Document Reviewed: 01/16/2018 Elsevier Patient Education  2020 Elsevier Inc.  

## 2019-04-30 NOTE — Assessment & Plan Note (Signed)
Previously managed by GYN Repeat Pap smear today If normal and HPV negative, will follow the 5 years schedule per guidelines

## 2019-04-30 NOTE — Assessment & Plan Note (Signed)
No symptoms of T2DM Continue to monitor annual A1c

## 2019-05-01 DIAGNOSIS — Z0000 Encounter for general adult medical examination without abnormal findings: Secondary | ICD-10-CM | POA: Diagnosis not present

## 2019-05-01 DIAGNOSIS — Z8632 Personal history of gestational diabetes: Secondary | ICD-10-CM | POA: Diagnosis not present

## 2019-05-02 LAB — CBC WITH DIFFERENTIAL/PLATELET
Basophils Absolute: 0.1 10*3/uL (ref 0.0–0.2)
Basos: 1 %
EOS (ABSOLUTE): 0.3 10*3/uL (ref 0.0–0.4)
Eos: 4 %
Hematocrit: 43.3 % (ref 34.0–46.6)
Hemoglobin: 13.9 g/dL (ref 11.1–15.9)
Immature Grans (Abs): 0 10*3/uL (ref 0.0–0.1)
Immature Granulocytes: 0 %
Lymphocytes Absolute: 2.8 10*3/uL (ref 0.7–3.1)
Lymphs: 37 %
MCH: 30.2 pg (ref 26.6–33.0)
MCHC: 32.1 g/dL (ref 31.5–35.7)
MCV: 94 fL (ref 79–97)
Monocytes Absolute: 0.6 10*3/uL (ref 0.1–0.9)
Monocytes: 7 %
Neutrophils Absolute: 3.9 10*3/uL (ref 1.4–7.0)
Neutrophils: 51 %
Platelets: 303 10*3/uL (ref 150–450)
RBC: 4.6 x10E6/uL (ref 3.77–5.28)
RDW: 12.2 % (ref 11.7–15.4)
WBC: 7.6 10*3/uL (ref 3.4–10.8)

## 2019-05-02 LAB — COMPREHENSIVE METABOLIC PANEL
ALT: 22 IU/L (ref 0–32)
AST: 18 IU/L (ref 0–40)
Albumin/Globulin Ratio: 1.9 (ref 1.2–2.2)
Albumin: 4.5 g/dL (ref 3.8–4.8)
Alkaline Phosphatase: 59 IU/L (ref 39–117)
BUN/Creatinine Ratio: 11 (ref 9–23)
BUN: 8 mg/dL (ref 6–20)
Bilirubin Total: 0.4 mg/dL (ref 0.0–1.2)
CO2: 21 mmol/L (ref 20–29)
Calcium: 9.6 mg/dL (ref 8.7–10.2)
Chloride: 103 mmol/L (ref 96–106)
Creatinine, Ser: 0.71 mg/dL (ref 0.57–1.00)
GFR calc Af Amer: 125 mL/min/{1.73_m2} (ref >59)
GFR calc non Af Amer: 108 mL/min/{1.73_m2} (ref >59)
Globulin, Total: 2.4 g/dL (ref 1.5–4.5)
Glucose: 93 mg/dL (ref 65–99)
Potassium: 4.4 mmol/L (ref 3.5–5.2)
Sodium: 138 mmol/L (ref 134–144)
Total Protein: 6.9 g/dL (ref 6.0–8.5)

## 2019-05-02 LAB — LIPID PANEL WITH LDL/HDL RATIO
Cholesterol, Total: 182 mg/dL (ref 100–199)
HDL: 53 mg/dL (ref >39)
LDL Chol Calc (NIH): 107 mg/dL — ABNORMAL HIGH (ref 0–99)
LDL/HDL Ratio: 2 ratio (ref 0.0–3.2)
Triglycerides: 125 mg/dL (ref 0–149)
VLDL Cholesterol Cal: 22 mg/dL (ref 5–40)

## 2019-05-02 LAB — HEMOGLOBIN A1C
Est. average glucose Bld gHb Est-mCnc: 108 mg/dL
Hgb A1c MFr Bld: 5.4 % (ref 4.8–5.6)

## 2019-05-04 ENCOUNTER — Telehealth: Payer: Self-pay

## 2019-05-04 LAB — CYTOLOGY - PAP
Comment: NEGATIVE
Diagnosis: NEGATIVE
High risk HPV: NEGATIVE

## 2019-05-04 NOTE — Telephone Encounter (Signed)
-----   Message from Virginia Crews, MD sent at 05/04/2019 11:44 AM EST ----- Normal pap smear. Repeat in 3-5 years.

## 2019-05-04 NOTE — Telephone Encounter (Signed)
Written by Virginia Crews, MD on 05/04/2019 11:44 AM EST Seen by patient Nash Dimmer on 05/04/2019 12:20 PM EST

## 2019-05-04 NOTE — Telephone Encounter (Signed)
-----   Message from Virginia Crews, MD sent at 05/04/2019  8:59 AM EST ----- Normal labs

## 2019-05-04 NOTE — Telephone Encounter (Signed)
Seen by patient Lauren Davis on 05/04/2019 9:03 AM EST

## 2019-07-04 IMAGING — MG DIGITAL SCREENING BILATERAL MAMMOGRAM WITH TOMO AND CAD
8 series · 8 of 24 positions shown · non-contrast
Comparison: None

CLINICAL DATA: Screening. Baseline examination.

EXAM:
DIGITAL SCREENING BILATERAL MAMMOGRAM WITH TOMO AND CAD

[L MLO synth-2D]
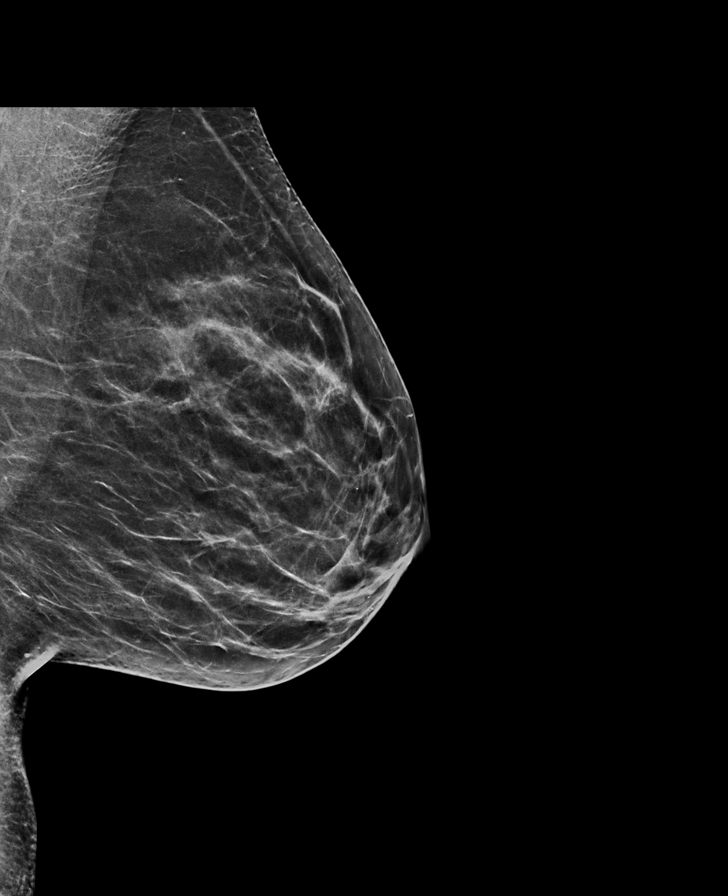

[R CC synth-2D]
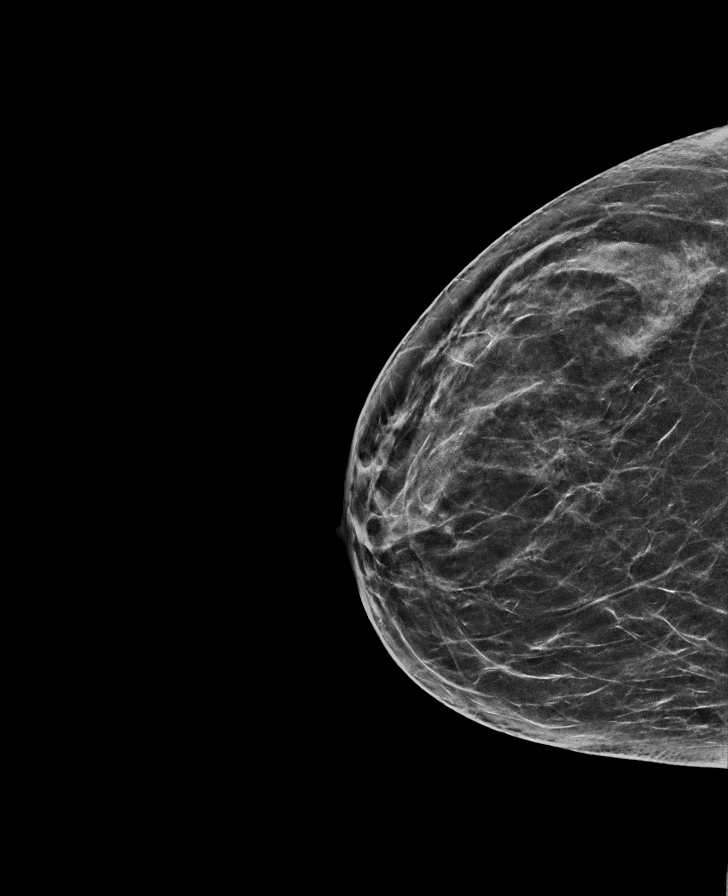

[L CC synth-2D]
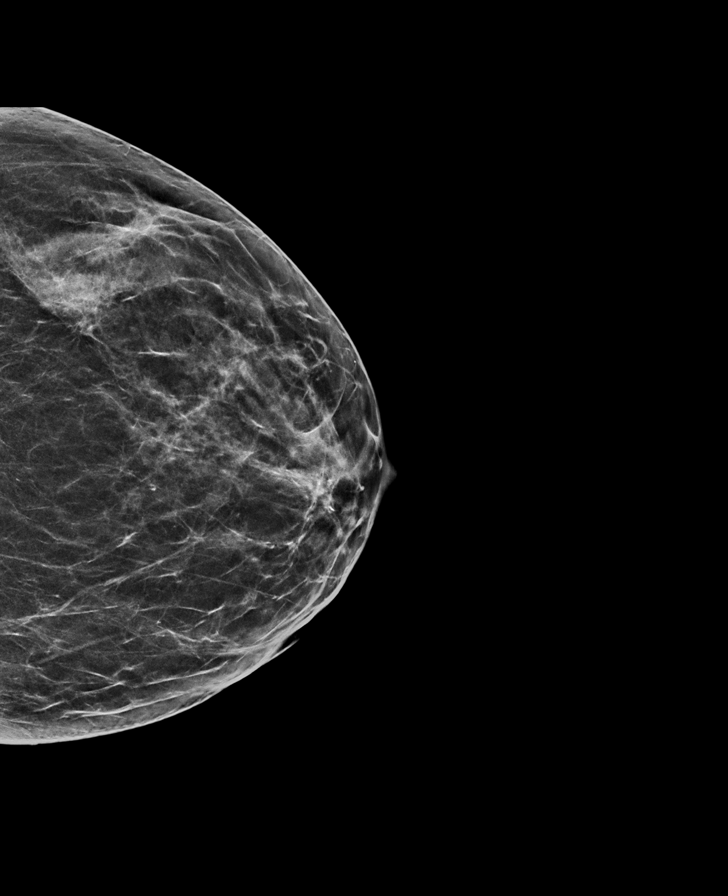

[R MLO synth-2D]
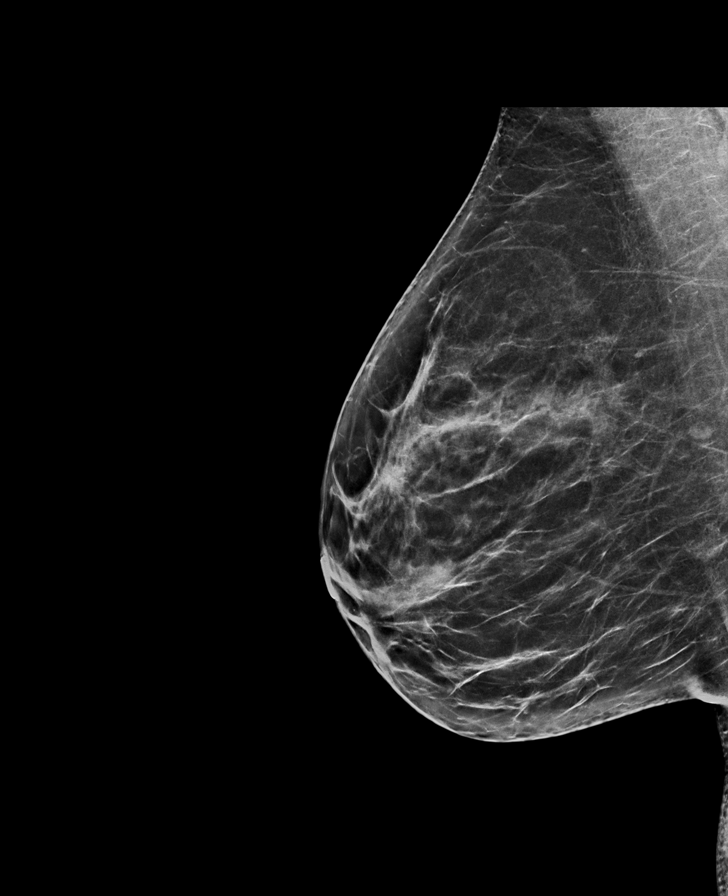

[L MLO tomo · tomo slice 33/66.0]
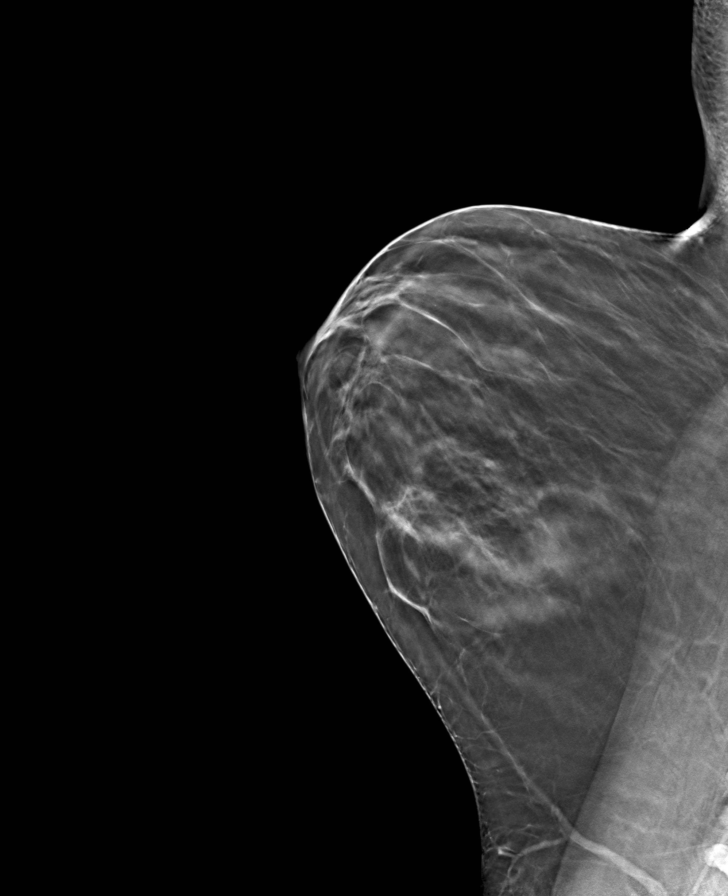

[L CC tomo · tomo slice 31/60.0]
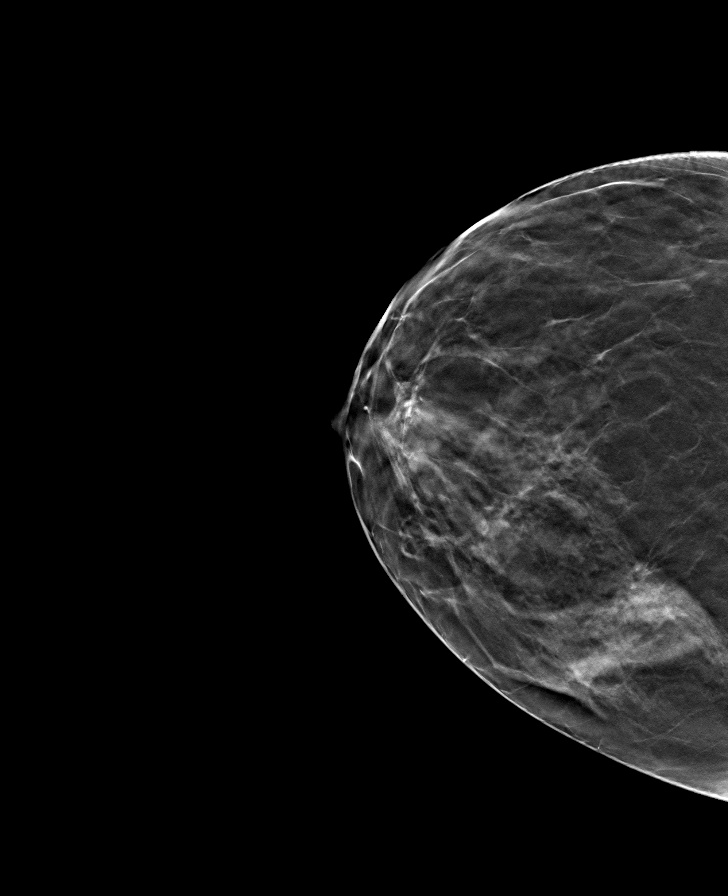

[R MLO tomo · tomo slice 35/70.0]
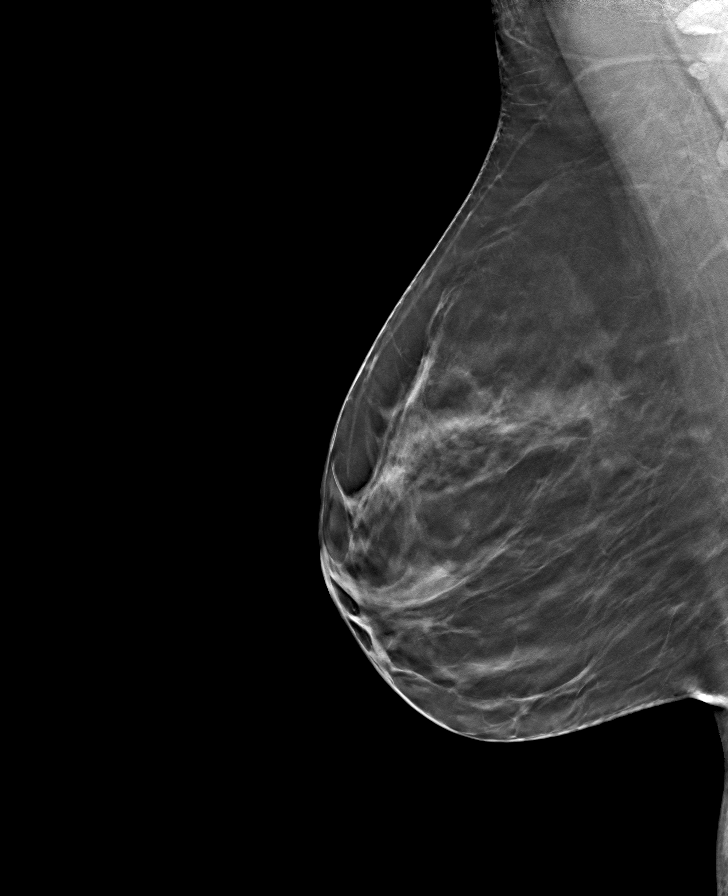

[R CC tomo · tomo slice 31/62.0]
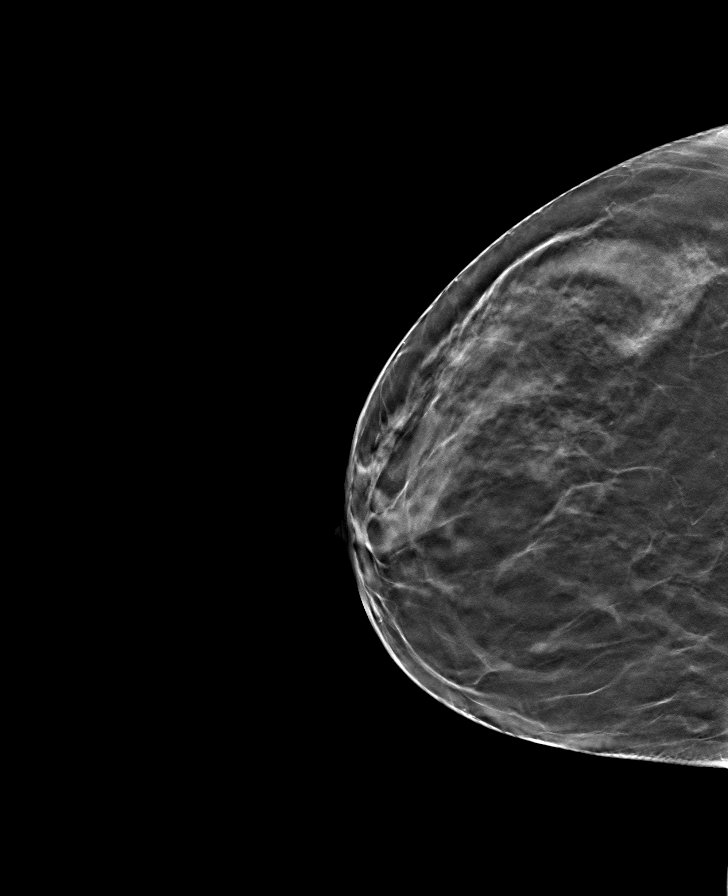

[8 of 24 positions shown; findings below may reference images not displayed]

ACR Breast Density Category b: There are scattered areas of
fibroglandular density.
FINDINGS: In the right breast, a possible asymmetry warrants further
evaluation. In the left breast, no findings suspicious for
malignancy. Images were processed with CAD.
IMPRESSION: Further evaluation is suggested for possible asymmetry in the right
breast.

RECOMMENDATION:
Diagnostic mammogram and possibly ultrasound of the right breast.
(Code:[id number])

The patient will be contacted regarding the findings, and additional
imaging will be scheduled.

BI-RADS CATEGORY  0: Incomplete. Need additional imaging evaluation
and/or prior mammograms for comparison.

## 2019-08-18 ENCOUNTER — Encounter: Payer: Self-pay | Admitting: Family Medicine

## 2020-04-19 ENCOUNTER — Encounter: Payer: Self-pay | Admitting: Family Medicine

## 2020-04-21 ENCOUNTER — Telehealth: Payer: Self-pay

## 2020-04-21 ENCOUNTER — Other Ambulatory Visit: Payer: Self-pay | Admitting: Family Medicine

## 2020-04-21 MED ORDER — ACYCLOVIR 400 MG PO TABS: 400.0000 mg | ORAL_TABLET | Freq: Two times a day (BID) | ORAL | 4 refills | Status: DC | PRN

## 2020-04-21 NOTE — Telephone Encounter (Signed)
Patient aware medication has been sent

## 2020-04-21 NOTE — Telephone Encounter (Signed)
Copied from CRM [id number]. Topic: General - Other >> Apr 21, 2020  8:53 AM Tamela Oddi wrote: Reason for CRM: Patient is calling to get an update from her My Chart message that she sent to the doctor.  Please advise and call patient to let her know at [phone]

## 2020-05-02 ENCOUNTER — Encounter: Payer: Self-pay | Admitting: Family Medicine

## 2020-05-02 ENCOUNTER — Other Ambulatory Visit: Payer: Self-pay

## 2020-05-02 ENCOUNTER — Ambulatory Visit (INDEPENDENT_AMBULATORY_CARE_PROVIDER_SITE_OTHER): Payer: No Typology Code available for payment source | Admitting: Family Medicine

## 2020-05-02 VITALS — BP 120/73 | HR 94 | Temp 98.5°F | Resp 16 | Ht 68.0 in | Wt 141.9 lb

## 2020-05-02 DIAGNOSIS — Z8632 Personal history of gestational diabetes: Secondary | ICD-10-CM

## 2020-05-02 DIAGNOSIS — Z1159 Encounter for screening for other viral diseases: Secondary | ICD-10-CM

## 2020-05-02 DIAGNOSIS — Z0000 Encounter for general adult medical examination without abnormal findings: Secondary | ICD-10-CM | POA: Diagnosis not present

## 2020-05-02 DIAGNOSIS — G43709 Chronic migraine without aura, not intractable, without status migrainosus: Secondary | ICD-10-CM | POA: Diagnosis not present

## 2020-05-02 MED ORDER — AMITRIPTYLINE HCL 10 MG PO TABS: 10.0000 mg | ORAL_TABLET | Freq: Every day | ORAL | 3 refills | Status: DC

## 2020-05-02 NOTE — Assessment & Plan Note (Signed)
No symptoms of T2DM Recheck A1c

## 2020-05-02 NOTE — Assessment & Plan Note (Signed)
moderately controlled on botox and amytryptiline Some difficulty with adherence to amytryptiline due to remembering and its sedating Discussed potential switch to propanolol, she would like to try better adherence with her current regimen for now

## 2020-05-02 NOTE — Patient Instructions (Signed)
Preventive Care 93-39 Years Old, Female Preventive care refers to visits with your health care provider and lifestyle choices that can promote health and wellness. This includes:  A yearly physical exam. This may also be called an annual well check.  Regular dental visits and eye exams.  Immunizations.  Screening for certain conditions.  Healthy lifestyle choices, such as eating a healthy diet, getting regular exercise, not using drugs or products that contain nicotine and tobacco, and limiting alcohol use. What can I expect for my preventive care visit? Physical exam Your health care provider will check your:  Height and weight. This may be used to calculate body mass index (BMI), which tells if you are at a healthy weight.  Heart rate and blood pressure.  Skin for abnormal spots. Counseling Your health care provider may ask you questions about your:  Alcohol, tobacco, and drug use.  Emotional well-being.  Home and relationship well-being.  Sexual activity.  Eating habits.  Work and work Statistician.  Method of birth control.  Menstrual cycle.  Pregnancy history. What immunizations do I need?  Influenza (flu) vaccine  This is recommended every year. Tetanus, diphtheria, and pertussis (Tdap) vaccine  You may need a Td booster every 10 years. Varicella (chickenpox) vaccine  You may need this if you have not been vaccinated. Human papillomavirus (HPV) vaccine  If recommended by your health care provider, you may need three doses over 6 months. Measles, mumps, and rubella (MMR) vaccine  You may need at least one dose of MMR. You may also need a second dose. Meningococcal conjugate (MenACWY) vaccine  One dose is recommended if you are age 39-21 years and a first-year college student living in a residence hall, or if you have one of several medical conditions. You may also need additional booster doses. Pneumococcal conjugate (PCV13) vaccine  You may need  this if you have certain conditions and were not previously vaccinated. Pneumococcal polysaccharide (PPSV23) vaccine  You may need one or two doses if you smoke cigarettes or if you have certain conditions. Hepatitis A vaccine  You may need this if you have certain conditions or if you travel or work in places where you may be exposed to hepatitis A. Hepatitis B vaccine  You may need this if you have certain conditions or if you travel or work in places where you may be exposed to hepatitis B. Haemophilus influenzae type b (Hib) vaccine  You may need this if you have certain conditions. You may receive vaccines as individual doses or as more than one vaccine together in one shot (combination vaccines). Talk with your health care provider about the risks and benefits of combination vaccines. What tests do I need?  Blood tests  Lipid and cholesterol levels. These may be checked every 5 years starting at age 69.  Hepatitis C test.  Hepatitis B test. Screening  Diabetes screening. This is done by checking your blood sugar (glucose) after you have not eaten for a while (fasting).  Sexually transmitted disease (STD) testing.  BRCA-related cancer screening. This may be done if you have a family history of breast, ovarian, tubal, or peritoneal cancers.  Pelvic exam and Pap test. This may be done every 3 years starting at age 43. Starting at age 42, this may be done every 5 years if you have a Pap test in combination with an HPV test. Talk with your health care provider about your test results, treatment options, and if necessary, the need for more tests.  Follow these instructions at home: Eating and drinking   Eat a diet that includes fresh fruits and vegetables, whole grains, lean protein, and low-fat dairy.  Take vitamin and mineral supplements as recommended by your health care provider.  Do not drink alcohol if: ? Your health care provider tells you not to drink. ? You are  pregnant, may be pregnant, or are planning to become pregnant.  If you drink alcohol: ? Limit how much you have to 0-1 drink a day. ? Be aware of how much alcohol is in your drink. In the [place] one drink equals one 12 oz bottle of beer (355 mL), one 5 oz glass of wine (148 mL), or one 1 oz glass of hard liquor (44 mL). Lifestyle  Take daily care of your teeth and gums.  Stay active. Exercise for at least 30 minutes on 5 or more days each week.  Do not use any products that contain nicotine or tobacco, such as cigarettes, e-cigarettes, and chewing tobacco. If you need help quitting, ask your health care provider.  If you are sexually active, practice safe sex. Use a condom or other form of birth control (contraception) in order to prevent pregnancy and STIs (sexually transmitted infections). If you plan to become pregnant, see your health care provider for a preconception visit. What's next?  Visit your health care provider once a year for a well check visit.  Ask your health care provider how often you should have your eyes and teeth checked.  Stay up to date on all vaccines. This information is not intended to replace advice given to you by your health care provider. Make sure you discuss any questions you have with your health care provider. Document Revised: 01/16/2018 Document Reviewed: 01/16/2018 Elsevier Patient Education  2020 [employer].

## 2020-05-02 NOTE — Assessment & Plan Note (Signed)
Screening CMP, FLP and CBC

## 2020-05-02 NOTE — Progress Notes (Signed)
Complete physical exam   Patient: Lauren Davis   DOB: 1981-04-07   39 y.o. Female  MRN: [id number] Visit Date: 05/02/2020  Today's healthcare provider: Shirlee Latch, MD   Chief Complaint  Patient presents with  . Annual Exam   Subjective    Lauren Davis is a 39 y.o. female who presents today for a complete physical exam.  She reports consuming a general diet. Gym/ health club routine includes 'bootcamp workouts'. She generally feels well. She reports sleeping well. She does not have additional problems to discuss today.  HPI   She started birth control again this year to help with her acne. She has lost 12-15 lbs with a dedicated exercise routine and focusing on eating healthy.   She has had some trouble with her headaches. She experiences pressure dizziness and lightheadedness throughout the day at work. She notes that the headache symptoms improve when she takes her amytryptaline, however, it is difficult for her to remember to take her medications and they make her sedated when she does take them.    Past Medical History:  Diagnosis Date  . Complication of anesthesia    SLOW TO WAKE UP  . Constipation   . Dysrhythmia    H/O METOPROLOL 2 YEARS AGO  . Fatigue   . Gestational diabetes    Gestational Diabetes/ resolved  . HPV in female   . Vaginal Pap smear, abnormal    Past Surgical History:  Procedure Laterality Date  . LAPAROSCOPIC TUBAL LIGATION Bilateral 05/06/2017   Procedure: LAPAROSCOPIC TUBAL LIGATION;  Surgeon: Hildred Laser, MD;  Location: [facility] ORS;  Service: Gynecology;  Laterality: Bilateral;  . TONSILLECTOMY  2004   Social History   Socioeconomic History  . Marital status: Married    Spouse name: Arriyah Madej  . Number of children: 2  . Years of education: 26  . Highest education level: Associate degree: academic program  Occupational History  . Occupation: Production designer, theatre/television/film at [employer]  Tobacco Use  . Smoking status: Former Smoker     Years: 8.00    Types: Cigarettes    Quit date: 05/20/2008    Years since quitting: 11.9  . Smokeless tobacco: Never Used  . Tobacco comment: former social smoker  Vaping Use  . Vaping Use: Never used  Substance and Sexual Activity  . Alcohol use: Yes    Alcohol/week: 0.0 standard drinks    Comment: occasional  . Drug use: No  . Sexual activity: Yes    Partners: Male    Birth control/protection: Surgical  Other Topics Concern  . Not on file  Social History Narrative  . Not on file   Social Determinants of Health   Financial Resource Strain: Not on file  Food Insecurity: Not on file  Transportation Needs: Not on file  Physical Activity: Not on file  Stress: Not on file  Social Connections: Not on file  Intimate Partner Violence: Not on file   Family Status  Relation Name Status  . Mother  Alive  . MGM  Deceased  . MGF  Deceased  . PGM  (Not Specified)  . PGF  (Not Specified)  . Father  Alive  . Brother  Alive  . Daughter  Alive  . Daughter  Alive  . Neg Hx  (Not Specified)   Family History  Problem Relation Age of Onset  . Diabetes Mother   . Hyperlipidemia Mother   . Heart disease Maternal Grandmother   . Diabetes  Maternal Grandmother   . Diabetes Maternal Grandfather   . Heart disease Paternal Grandmother   . Heart disease Paternal Grandfather   . Hyperlipidemia Father   . Hypertension Father   . Non-Hodgkin's lymphoma Father   . Hypertension Brother   . Colon cancer Neg Hx   . Breast cancer Neg Hx   . Cervical cancer Neg Hx    Allergies  Allergen Reactions  . Other Rash    Derma-bond   . Sulfa Antibiotics Rash    Patient Care Team: Erasmo Downer, MD as PCP - General (Family Medicine)   Medications: Outpatient Medications Prior to Visit  Medication Sig  . acyclovir (ZOVIRAX) 400 MG tablet Take 1 tablet (400 mg total) by mouth 2 (two) times daily as needed (outbreaks).  . drospirenone-ethinyl estradiol (YAZ) 3-0.02 MG tablet Take 1  tablet by mouth daily.  . fexofenadine-pseudoephedrine (ALLEGRA-D 24) 180-240 MG 24 hr tablet Take 1 tablet by mouth daily as needed (allergies).  . [DISCONTINUED] amitriptyline (ELAVIL) 10 MG tablet Take 1 tablet (10 mg total) by mouth at bedtime.   No facility-administered medications prior to visit.    Review of Systems  Constitutional: Negative.   HENT: Negative.   Eyes: Negative.   Respiratory: Negative.   Cardiovascular: Negative.   Gastrointestinal: Negative.   Endocrine: Negative.   Genitourinary: Negative.   Musculoskeletal: Negative.   Skin: Negative.   Allergic/Immunologic: Negative.   Neurological: Positive for dizziness, light-headedness and headaches.  Hematological: Negative.   Psychiatric/Behavioral: Negative.       Objective    BP 120/73 (BP Location: Left Arm, Patient Position: Sitting, Cuff Size: Large)   Pulse 94   Temp 98.5 F (36.9 C) (Oral)   Resp 16   Ht 5\' 8"  (1.727 m)   Wt 141 lb 14.4 oz (64.4 kg)   SpO2 100%   BMI 21.58 kg/m    Physical Exam   General: Well appearing, in NAD Cards: RRR, no murmurs rubs or gallops Pulm:Luings CTA bilaterally GI: Soft Nontender   Last depression screening scores PHQ 2/9 Scores 05/02/2020 04/30/2019 09/11/2018  PHQ - 2 Score 0 0 0  PHQ- 9 Score 3 0 -   Last fall risk screening Fall Risk  05/02/2020  Falls in the past year? 0  Number falls in past yr: -  Injury with Fall? -  Follow up -   Last Audit-C alcohol use screening Alcohol Use Disorder Test (AUDIT) 05/02/2020  1. How often do you have a drink containing alcohol? 2  2. How many drinks containing alcohol do you have on a typical day when you are drinking? 1  3. How often do you have six or more drinks on one occasion? 1  AUDIT-C Score 4  Alcohol Brief Interventions/Follow-up -   A score of 3 or more in women, and 4 or more in men indicates increased risk for alcohol abuse, EXCEPT if all of the points are from question 1   No results  found for any visits on 05/02/20.  Assessment & Plan    Routine Health Maintenance and Physical Exam  Exercise Activities and Dietary recommendations Goals   None     Immunization History  Administered Date(s) Administered  . Influenza,inj,Quad PF,6+ Mos 02/26/2017  . Influenza-Unspecified 03/06/2019  . Tdap 06/21/2015    Health Maintenance  Topic Date Due  . Hepatitis C Screening  Never done  . COVID-19 Vaccine (1) Never done  . INFLUENZA VACCINE  12/20/2019  . PAP SMEAR-Modifier  04/29/2024  . TETANUS/TDAP  06/20/2025  . HIV Screening  Completed    Discussed health benefits of physical activity, and encouraged her to engage in regular exercise appropriate for her age and condition.  Problem List Items Addressed This Visit      Cardiovascular and Mediastinum   Migraine    moderately controlled on botox and amytryptiline Some difficulty with adherence to amytryptiline due to remembering and its sedating Discussed potential switch to propanolol, she would like to try better adherence with her current regimen for now        Relevant Medications   amitriptyline (ELAVIL) 10 MG tablet     Other   History of gestational diabetes    No symptoms of T2DM Recheck A1c      Relevant Orders   HgB A1c   Encounter for annual physical exam - Primary    Screening CMP, FLP and CBC      Relevant Orders   Comprehensive Metabolic Panel (CMET)   Lipid Panel w/o Chol/HDL Ratio   CBC   HgB A1c   Hepatitis C Antibody    Other Visit Diagnoses    Need for hepatitis C screening test       Relevant Orders   Hepatitis C Antibody       Return in about 1 year (around 05/02/2021) for CPE.     Patient seen along with [employer] student [facility]. I personally evaluated this patient along with the student, and verified all aspects of the history, physical exam, and medical decision making as documented by the student. I agree with the student's documentation and have made all  necessary edits.  Stephinie Battisti, Marzella Schlein, MD, MPH [facility]

## 2020-05-05 LAB — LIPID PANEL W/O CHOL/HDL RATIO
Cholesterol, Total: 182 mg/dL (ref 100–199)
HDL: 64 mg/dL (ref >39)
LDL Chol Calc (NIH): 100 mg/dL — ABNORMAL HIGH (ref 0–99)
Triglycerides: 101 mg/dL (ref 0–149)
VLDL Cholesterol Cal: 18 mg/dL (ref 5–40)

## 2020-05-05 LAB — CBC
Hematocrit: 40.8 % (ref 34.0–46.6)
Hemoglobin: 14.1 g/dL (ref 11.1–15.9)
MCH: 31.7 pg (ref 26.6–33.0)
MCHC: 34.6 g/dL (ref 31.5–35.7)
MCV: 92 fL (ref 79–97)
Platelets: 297 10*3/uL (ref 150–450)
RBC: 4.45 x10E6/uL (ref 3.77–5.28)
RDW: 12.1 % (ref 11.7–15.4)
WBC: 5.9 10*3/uL (ref 3.4–10.8)

## 2020-05-05 LAB — COMPREHENSIVE METABOLIC PANEL
ALT: 13 IU/L (ref 0–32)
AST: 11 IU/L (ref 0–40)
Albumin/Globulin Ratio: 1.6 (ref 1.2–2.2)
Albumin: 4.2 g/dL (ref 3.8–4.8)
Alkaline Phosphatase: 43 IU/L — ABNORMAL LOW (ref 44–121)
BUN/Creatinine Ratio: 13 (ref 9–23)
BUN: 12 mg/dL (ref 6–20)
Bilirubin Total: 0.4 mg/dL (ref 0.0–1.2)
CO2: 20 mmol/L (ref 20–29)
Calcium: 9.6 mg/dL (ref 8.7–10.2)
Chloride: 106 mmol/L (ref 96–106)
Creatinine, Ser: 0.94 mg/dL (ref 0.57–1.00)
GFR calc Af Amer: 88 mL/min/{1.73_m2} (ref >59)
GFR calc non Af Amer: 77 mL/min/{1.73_m2} (ref >59)
Globulin, Total: 2.6 g/dL (ref 1.5–4.5)
Glucose: 93 mg/dL (ref 65–99)
Potassium: 5 mmol/L (ref 3.5–5.2)
Sodium: 141 mmol/L (ref 134–144)
Total Protein: 6.8 g/dL (ref 6.0–8.5)

## 2020-05-05 LAB — HEPATITIS C ANTIBODY: Hep C Virus Ab: <0.1 s/co ratio (ref 0.0–0.9)

## 2020-05-05 LAB — HEMOGLOBIN A1C
Est. average glucose Bld gHb Est-mCnc: 105 mg/dL
Hgb A1c MFr Bld: 5.3 % (ref 4.8–5.6)

## 2020-07-05 ENCOUNTER — Encounter: Payer: Self-pay | Admitting: Family Medicine

## 2020-07-05 ENCOUNTER — Other Ambulatory Visit: Payer: Self-pay | Admitting: Family Medicine

## 2020-07-05 MED ORDER — DROSPIRENONE-ETHINYL ESTRADIOL 3-0.02 MG PO TABS: 1.0000 | ORAL_TABLET | Freq: Every day | ORAL | 11 refills | Status: DC

## 2020-09-29 ENCOUNTER — Other Ambulatory Visit: Payer: Self-pay

## 2020-09-29 MED FILL — Drospirenone-Ethinyl Estradiol Tab 3-0.02 MG: ORAL | 28 days supply | Qty: 28 | Fill #0 | Status: AC

## 2020-10-20 ENCOUNTER — Other Ambulatory Visit: Payer: Self-pay

## 2020-10-20 MED FILL — Drospirenone-Ethinyl Estradiol Tab 3-0.02 MG: ORAL | 84 days supply | Qty: 84 | Fill #1 | Status: AC

## 2020-10-21 ENCOUNTER — Other Ambulatory Visit: Payer: Self-pay

## 2020-10-25 ENCOUNTER — Other Ambulatory Visit: Payer: Self-pay

## 2020-10-25 ENCOUNTER — Other Ambulatory Visit: Payer: Self-pay | Admitting: Nurse Practitioner

## 2020-10-25 DIAGNOSIS — A09 Infectious gastroenteritis and colitis, unspecified: Secondary | ICD-10-CM

## 2020-10-25 MED ORDER — AZITHROMYCIN 250 MG PO TABS
ORAL_TABLET | ORAL | 0 refills | Status: AC
  Filled 2020-10-25: qty 6, 5d supply, fill #0

## 2020-10-25 NOTE — Progress Notes (Signed)
Traveling to [place] prescription for as needed travelers diarrhea as discussed.

## 2020-11-17 ENCOUNTER — Other Ambulatory Visit: Payer: Self-pay

## 2020-11-17 MED FILL — Acyclovir Tab 400 MG: ORAL | 30 days supply | Qty: 60 | Fill #0 | Status: AC

## 2021-01-09 ENCOUNTER — Other Ambulatory Visit: Payer: Self-pay

## 2021-01-09 MED FILL — Drospirenone-Ethinyl Estradiol Tab 3-0.02 MG: ORAL | 84 days supply | Qty: 84 | Fill #2 | Status: AC

## 2021-02-27 ENCOUNTER — Other Ambulatory Visit: Payer: Self-pay

## 2021-02-27 MED FILL — Acyclovir Tab 400 MG: ORAL | 30 days supply | Qty: 60 | Fill #1 | Status: AC

## 2021-03-20 ENCOUNTER — Telehealth: Payer: Self-pay | Admitting: Family Medicine

## 2021-03-20 ENCOUNTER — Ambulatory Visit: Payer: No Typology Code available for payment source | Admitting: Family Medicine

## 2021-03-20 NOTE — Telephone Encounter (Signed)
Pt called to cancel appt for today. And was notified today that her 12/22 needed to be rescheduled.  The soonest CPE appt is 3/23. Pt states that she was notified that the cpe needed to be rescheduled. And is hoping to be worked in 2022 for a CPE. Please advise   CB- [phone]

## 2021-04-04 ENCOUNTER — Other Ambulatory Visit: Payer: Self-pay

## 2021-04-04 MED FILL — Drospirenone-Ethinyl Estradiol Tab 3-0.02 MG: ORAL | 56 days supply | Qty: 56 | Fill #3 | Status: AC

## 2021-05-04 ENCOUNTER — Ambulatory Visit (INDEPENDENT_AMBULATORY_CARE_PROVIDER_SITE_OTHER): Payer: No Typology Code available for payment source | Admitting: Family Medicine

## 2021-05-04 ENCOUNTER — Encounter: Payer: Self-pay | Admitting: Family Medicine

## 2021-05-04 ENCOUNTER — Other Ambulatory Visit: Payer: Self-pay

## 2021-05-04 VITALS — BP 117/59 | HR 90 | Temp 98.3°F | Ht 68.0 in | Wt 146.6 lb

## 2021-05-04 DIAGNOSIS — R5382 Chronic fatigue, unspecified: Secondary | ICD-10-CM

## 2021-05-04 DIAGNOSIS — G4700 Insomnia, unspecified: Secondary | ICD-10-CM | POA: Diagnosis not present

## 2021-05-04 DIAGNOSIS — Z0000 Encounter for general adult medical examination without abnormal findings: Secondary | ICD-10-CM

## 2021-05-04 DIAGNOSIS — G5603 Carpal tunnel syndrome, bilateral upper limbs: Secondary | ICD-10-CM | POA: Diagnosis not present

## 2021-05-04 DIAGNOSIS — G43709 Chronic migraine without aura, not intractable, without status migrainosus: Secondary | ICD-10-CM | POA: Diagnosis not present

## 2021-05-04 DIAGNOSIS — Z1231 Encounter for screening mammogram for malignant neoplasm of breast: Secondary | ICD-10-CM | POA: Diagnosis not present

## 2021-05-04 NOTE — Assessment & Plan Note (Signed)
Longstanding difficulty staying asleep with occasional difficulty falling asleep She describes herself as a light sleeper She denies any snoring or apnea, but she does have nonrestorative sleep Consider sleep study pending labs and neuro eval

## 2021-05-04 NOTE — Assessment & Plan Note (Signed)
Continues to have symptoms of migraine without pain, such as dizziness ?ocular migraine Failed amitriptyline Referral to neuro for further eval and management

## 2021-05-04 NOTE — Progress Notes (Signed)
Complete physical exam   Patient: Lauren Davis   DOB: [birth year]   40 y.o. Female  MRN: [id number] Visit Date: 05/04/2021  Today's healthcare provider: Shirlee Latch, MD   Chief Complaint  Patient presents with   Dizziness    Dizzy at times "silent migraines maybe "    Numbness    In finger tips and feet at times started this year 2-4 times a months    Annual Exam   Subjective    Lauren Davis is a 40 y.o. female who presents today for a complete physical exam.  She reports consuming a general diet. Does not exercise  She generally feels well. She reports sleeping fair  She does have additional problems to discuss today. Light headed dizzy at times  HPI HPI     Dizziness    Additional comments: Dizzy at times "silent migraines maybe "         Numbness    Additional comments: In finger tips and feet at times started this year 2-4 times a months       Last edited by Erasmo Downer, MD on 05/04/2021  3:55 PM.       Jamal Maes amitriptyline for ocular migraines x1.5 months, but was still having symptoms. Tried dietary changes. Still continue intermittently. 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  Feels very fatigued. Difficulty falling asleep. Using ear plugs as she is a light sleeper.  Legs are cramping more now at bedtimes.  Intermittent finger numbness L>R. Very intermittent. Mostly 1st-3rd fingers. More so in the mornings. Types a lot.  Past Medical History:  Diagnosis Date   Complication of anesthesia    SLOW TO WAKE UP   Constipation    Dysrhythmia    H/O METOPROLOL 2 YEARS AGO   Fatigue    Gestational diabetes    Gestational Diabetes/ resolved   HPV in female    Vaginal Pap smear, abnormal    Past Surgical History:  Procedure Laterality Date   LAPAROSCOPIC TUBAL LIGATION Bilateral 05/06/2017   Procedure: LAPAROSCOPIC TUBAL LIGATION;  Surgeon: Hildred Laser, MD;  Location: [facility];  Service: Gynecology;  Laterality: Bilateral;    TONSILLECTOMY  2004   Social History   Socioeconomic History   Marital status: Married    Spouse name: Jelicia Nantz   Number of children: 2   Years of education: 14   Highest education level: Associate degree: academic program  Occupational History   Occupation: Production designer, theatre/television/film at [employer]  Tobacco Use   Smoking status: Former    Years: 8.00    Types: Cigarettes    Quit date: 05/20/2008    Years since quitting: 12.9   Smokeless tobacco: Never   Tobacco comments:    former social smoker  Vaping Use   Vaping Use: Never used  Substance and Sexual Activity   Alcohol use: Yes    Alcohol/week: 0.0 standard drinks    Comment: occasional   Drug use: No   Sexual activity: Yes    Partners: Male    Birth control/protection: Surgical  Other Topics Concern   Not on file  Social History Narrative   Not on file   Social Determinants of Health   Financial Resource Strain: Not on file  Food Insecurity: Not on file  Transportation Needs: Not on file  Physical Activity: Not on file  Stress: Not on file  Social Connections: Not on file  Intimate Partner Violence: Not on file  Family Status  Relation Name Status   Mother  Alive   MGM  Deceased   MGF  Deceased   PGM  (Not Specified)   PGF  (Not Specified)   Father  Nature conservation officer   Daughter  Alive   Daughter  Alive   Neg Hx  (Not Specified)   Family History  Problem Relation Age of Onset   Diabetes Mother    Hyperlipidemia Mother    Heart disease Maternal Grandmother    Diabetes Maternal Grandmother    Diabetes Maternal Grandfather    Heart disease Paternal Grandmother    Heart disease Paternal Grandfather    Hyperlipidemia Father    Hypertension Father    Non-Hodgkin's lymphoma Father    Hypertension Brother    Colon cancer Neg Hx    Breast cancer Neg Hx    Cervical cancer Neg Hx    Allergies  Allergen Reactions   Other Rash    Derma-bond    Sulfa Antibiotics Rash    Patient Care  Team: Erasmo Downer, MD as PCP - General (Family Medicine)   Medications: Outpatient Medications Prior to Visit  Medication Sig   acyclovir (ZOVIRAX) 400 MG tablet Take 400 mg by mouth in the morning and at bedtime.   cetirizine (ZYRTEC) 10 MG chewable tablet Chew 10 mg by mouth as needed for allergies.   drospirenone-ethinyl estradiol (YAZ) 3-0.02 MG tablet TAKE 1 TABLET BY MOUTH DAILY.   fexofenadine-pseudoephedrine (ALLEGRA-D 24) 180-240 MG 24 hr tablet Take 1 tablet by mouth daily as needed (allergies).   [DISCONTINUED] amitriptyline (ELAVIL) 10 MG tablet Take 1 tablet (10 mg total) by mouth at bedtime. (Patient not taking: Reported on 05/04/2021)   No facility-administered medications prior to visit.    Review of Systems  All other systems reviewed and are negative.    Objective    BP (!) 117/59 (BP Location: Right Arm, Patient Position: Sitting, Cuff Size: Normal)    Pulse 90    Temp 98.3 F (36.8 C) (Oral)    Ht 5\' 8"  (1.727 m)    Wt 146 lb 9.6 oz (66.5 kg)    SpO2 100%    BMI 22.29 kg/m    Physical Exam Vitals reviewed.  Constitutional:      General: She is not in acute distress.    Appearance: Normal appearance. She is well-developed. She is not diaphoretic.  HENT:     Head: Normocephalic and atraumatic.     Right Ear: Tympanic membrane, ear canal and external ear normal.     Left Ear: Tympanic membrane, ear canal and external ear normal.     Nose: Nose normal.     Mouth/Throat:     Mouth: Mucous membranes are moist.     Pharynx: Oropharynx is clear. No oropharyngeal exudate.  Eyes:     General: No scleral icterus.    Conjunctiva/sclera: Conjunctivae normal.     Pupils: Pupils are equal, round, and reactive to light.  Neck:     Thyroid: No thyromegaly.  Cardiovascular:     Rate and Rhythm: Normal rate and regular rhythm.     Pulses: Normal pulses.     Heart sounds: Normal heart sounds. No murmur heard. Pulmonary:     Effort: Pulmonary effort is  normal. No respiratory distress.     Breath sounds: Normal breath sounds. No wheezing or rales.  Abdominal:     General: There is no distension.     Palpations: Abdomen is  soft.     Tenderness: There is no abdominal tenderness.  Musculoskeletal:        General: No deformity.     Cervical back: Neck supple.     Right lower leg: No edema.     Left lower leg: No edema.     Comments: B/l +Phalens, negative tinels  Lymphadenopathy:     Cervical: No cervical adenopathy.  Skin:    General: Skin is warm and dry.     Findings: No rash.  Neurological:     Mental Status: She is alert and oriented to person, place, and time. Mental status is at baseline.     Sensory: No sensory deficit.     Motor: No weakness.     Gait: Gait normal.  Psychiatric:        Mood and Affect: Mood normal.        Behavior: Behavior normal.        Thought Content: Thought content normal.      Last depression screening scores PHQ 2/9 Scores 05/04/2021 05/02/2020 04/30/2019  PHQ - 2 Score 0 0 0  PHQ- 9 Score 5 3 0   Last fall risk screening Fall Risk  05/02/2020  Falls in the past year? 0  Number falls in past yr: -  Injury with Fall? -  Follow up -   Last Audit-C alcohol use screening Alcohol Use Disorder Test (AUDIT) 05/04/2021  1. How often do you have a drink containing alcohol? 3  2. How many drinks containing alcohol do you have on a typical day when you are drinking? 0  3. How often do you have six or more drinks on one occasion? 1  AUDIT-C Score 4  Alcohol Brief Interventions/Follow-up -   A score of 3 or more in women, and 4 or more in men indicates increased risk for alcohol abuse, EXCEPT if all of the points are from question 1   No results found for any visits on 05/04/21.  Assessment & Plan    Routine Health Maintenance and Physical Exam  Exercise Activities and Dietary recommendations  Goals   None     Immunization History  Administered Date(s) Administered   Influenza,inj,Quad  PF,6+ Mos 02/26/2017   Influenza-Unspecified 03/06/2019, 03/11/2020, 03/16/2021   Janssen (J&J) SARS-COV-2 Vaccination 08/17/2019   PFIZER(Purple Top)SARS-COV-2 Vaccination 04/08/2020   Tdap 06/21/2015    Health Maintenance  Topic Date Due   PAP SMEAR-Modifier  04/29/2024   TETANUS/TDAP  06/20/2025   INFLUENZA VACCINE  Completed   Hepatitis C Screening  Completed   HIV Screening  Completed   Pneumococcal Vaccine 38-23 Years old  Aged Out   HPV VACCINES  Aged Out   COVID-19 Vaccine  Discontinued    Discussed health benefits of physical activity, and encouraged her to engage in regular exercise appropriate for her age and condition.  Problem List Items Addressed This Visit       Cardiovascular and Mediastinum   Migraine    Continues to have symptoms of migraine without pain, such as dizziness ?ocular migraine Failed amitriptyline Referral to neuro for further eval and management      Relevant Orders   Ambulatory referral to Neurology     Nervous and Auditory   Bilateral carpal tunnel syndrome    New problem Exam is consistent with carpal tunnel syndrome Discussed sleeping in wrist splints She can use these during her workday while typing if she desires as well Can consider injections or surgery in the future if  worsens        Other   Encounter for annual physical exam - Primary   Relevant Orders   Comprehensive metabolic panel   Lipid panel   VITAMIN D 25 Hydroxy (Vit-D Deficiency, Fractures)   B12   CBC   TSH   Hemoglobin A1c   Chronic fatigue    Longstanding We will recheck labs, though they have been normal in the past Consider sleep study as below      Relevant Orders   VITAMIN D 25 Hydroxy (Vit-D Deficiency, Fractures)   B12   CBC   TSH   Insomnia    Longstanding difficulty staying asleep with occasional difficulty falling asleep She describes herself as a light sleeper She denies any snoring or apnea, but she does have nonrestorative  sleep Consider sleep study pending labs and neuro eval      Relevant Orders   VITAMIN D 25 Hydroxy (Vit-D Deficiency, Fractures)   B12   CBC   TSH   Other Visit Diagnoses     Screening mammogram for breast cancer       Relevant Orders   MM 3D SCREEN BREAST BILATERAL        Return in about 1 year (around 05/04/2022) for CPE.     I, Shirlee Latch, MD, have reviewed all documentation for this visit. The documentation on 05/04/21 for the exam, diagnosis, procedures, and orders are all accurate and complete.   Lannette Avellino, Marzella Schlein, MD, MPH [facility]

## 2021-05-04 NOTE — Assessment & Plan Note (Signed)
New problem Exam is consistent with carpal tunnel syndrome Discussed sleeping in wrist splints She can use these during her workday while typing if she desires as well Can consider injections or surgery in the future if worsens

## 2021-05-04 NOTE — Assessment & Plan Note (Signed)
Longstanding We will recheck labs, though they have been normal in the past Consider sleep study as below

## 2021-05-06 LAB — VITAMIN B12: Vitamin B-12: 287 pg/mL (ref 232–1245)

## 2021-05-06 LAB — CBC
Hematocrit: 41.5 % (ref 34.0–46.6)
Hemoglobin: 14 g/dL (ref 11.1–15.9)
MCH: 31.3 pg (ref 26.6–33.0)
MCHC: 33.7 g/dL (ref 31.5–35.7)
MCV: 93 fL (ref 79–97)
Platelets: 316 10*3/uL (ref 150–450)
RBC: 4.48 x10E6/uL (ref 3.77–5.28)
RDW: 12.5 % (ref 11.7–15.4)
WBC: 6.9 10*3/uL (ref 3.4–10.8)

## 2021-05-06 LAB — COMPREHENSIVE METABOLIC PANEL
ALT: 15 IU/L (ref 0–32)
AST: 16 IU/L (ref 0–40)
Albumin/Globulin Ratio: 1.7 (ref 1.2–2.2)
Albumin: 4.2 g/dL (ref 3.8–4.8)
Alkaline Phosphatase: 47 IU/L (ref 44–121)
BUN/Creatinine Ratio: 10 (ref 9–23)
BUN: 9 mg/dL (ref 6–24)
Bilirubin Total: 0.3 mg/dL (ref 0.0–1.2)
CO2: 21 mmol/L (ref 20–29)
Calcium: 9.4 mg/dL (ref 8.7–10.2)
Chloride: 105 mmol/L (ref 96–106)
Creatinine, Ser: 0.88 mg/dL (ref 0.57–1.00)
Globulin, Total: 2.5 g/dL (ref 1.5–4.5)
Glucose: 93 mg/dL (ref 70–99)
Potassium: 5.2 mmol/L (ref 3.5–5.2)
Sodium: 141 mmol/L (ref 134–144)
Total Protein: 6.7 g/dL (ref 6.0–8.5)
eGFR: 85 mL/min/{1.73_m2} (ref >59)

## 2021-05-06 LAB — LIPID PANEL
Chol/HDL Ratio: 2.7 ratio (ref 0.0–4.4)
Cholesterol, Total: 192 mg/dL (ref 100–199)
HDL: 71 mg/dL (ref >39)
LDL Chol Calc (NIH): 105 mg/dL — ABNORMAL HIGH (ref 0–99)
Triglycerides: 88 mg/dL (ref 0–149)
VLDL Cholesterol Cal: 16 mg/dL (ref 5–40)

## 2021-05-06 LAB — TSH: TSH: 2.6 u[IU]/mL (ref 0.450–4.500)

## 2021-05-06 LAB — HEMOGLOBIN A1C
Est. average glucose Bld gHb Est-mCnc: 105 mg/dL
Hgb A1c MFr Bld: 5.3 % (ref 4.8–5.6)

## 2021-05-06 LAB — VITAMIN D 25 HYDROXY (VIT D DEFICIENCY, FRACTURES): Vit D, 25-Hydroxy: 44 ng/mL (ref 30.0–100.0)

## 2021-05-09 ENCOUNTER — Encounter: Payer: No Typology Code available for payment source | Admitting: Family Medicine

## 2021-05-28 ENCOUNTER — Other Ambulatory Visit: Payer: Self-pay | Admitting: Family Medicine

## 2021-05-28 NOTE — Telephone Encounter (Signed)
Requested medication (s) are due for refill today: yes  Requested medication (s) are on the active medication list: yes Prescription ends 07/05/21  Last refill:  07/05/20 #28 11 RF  Future visit scheduled: yes in 11 months  Notes to clinic:  please review if prescription can be renewed  set to end on 07/05/21   Requested Prescriptions  Pending Prescriptions Disp Refills   drospirenone-ethinyl estradiol (YAZ) 3-0.02 MG tablet 28 tablet 11    Sig: TAKE 1 TABLET BY MOUTH DAILY.     OB/GYN:  Contraceptives Passed - 05/28/2021  9:08 AM      Passed - Last BP in normal range    BP Readings from Last 1 Encounters:  05/04/21 (!) 117/59          Passed - Valid encounter within last 12 months    Recent Outpatient Visits           3 weeks ago Encounter for annual physical exam   [facility], Marzella Schlein, MD   1 year ago Encounter for annual physical exam   [facility] [place], Marzella Schlein, MD   2 years ago Annual physical exam   [facility] [place], Marzella Schlein, MD   2 years ago Chronic migraine   [facility], Marzella Schlein, MD   3 years ago Migraine variant   [facility] [place], Marzella Schlein, MD       Future Appointments             In 11 months [place], Marzella Schlein, MD [facility]

## 2021-05-29 ENCOUNTER — Other Ambulatory Visit: Payer: Self-pay

## 2021-05-29 MED ORDER — DROSPIRENONE-ETHINYL ESTRADIOL 3-0.02 MG PO TABS
1.0000 | ORAL_TABLET | Freq: Every day | ORAL | 9 refills | Status: DC
  Filled 2021-05-29: qty 28, 28d supply, fill #0
  Filled 2021-06-28: qty 84, 84d supply, fill #1
  Filled 2021-09-18: qty 84, 84d supply, fill #2
  Filled 2021-12-04: qty 84, 84d supply, fill #3

## 2021-05-29 MED ORDER — ACYCLOVIR 400 MG PO TABS
400.0000 mg | ORAL_TABLET | Freq: Two times a day (BID) | ORAL | 0 refills | Status: DC
  Filled 2021-05-29: qty 60, 30d supply, fill #0

## 2021-06-28 ENCOUNTER — Other Ambulatory Visit: Payer: Self-pay

## 2021-06-29 ENCOUNTER — Other Ambulatory Visit: Payer: Self-pay

## 2021-06-29 ENCOUNTER — Ambulatory Visit
Admission: RE | Admit: 2021-06-29 | Discharge: 2021-06-29 | Disposition: A | Discharge status: Home / Self Care | Payer: No Typology Code available for payment source | Source: Ambulatory Visit | Attending: Family Medicine | Admitting: Family Medicine

## 2021-06-29 DIAGNOSIS — Z1231 Encounter for screening mammogram for malignant neoplasm of breast: Secondary | ICD-10-CM | POA: Diagnosis not present

## 2021-08-04 ENCOUNTER — Encounter: Payer: No Typology Code available for payment source | Admitting: Family Medicine

## 2021-08-14 ENCOUNTER — Other Ambulatory Visit: Payer: Self-pay | Admitting: Family Medicine

## 2021-08-14 ENCOUNTER — Other Ambulatory Visit: Payer: Self-pay

## 2021-08-14 MED ORDER — ACYCLOVIR 400 MG PO TABS
400.0000 mg | ORAL_TABLET | Freq: Two times a day (BID) | ORAL | 5 refills | Status: DC
  Filled 2021-08-14: qty 60, 30d supply, fill #0
  Filled 2021-09-18: qty 60, 30d supply, fill #1
  Filled 2021-10-20: qty 60, 30d supply, fill #2
  Filled 2021-12-04: qty 60, 30d supply, fill #3
  Filled 2022-05-08: qty 60, 30d supply, fill #4
  Filled 2022-07-05: qty 60, 30d supply, fill #5

## 2021-09-18 ENCOUNTER — Ambulatory Visit (INDEPENDENT_AMBULATORY_CARE_PROVIDER_SITE_OTHER): Payer: No Typology Code available for payment source | Admitting: Family Medicine

## 2021-09-18 ENCOUNTER — Encounter: Payer: Self-pay | Admitting: Family Medicine

## 2021-09-18 ENCOUNTER — Other Ambulatory Visit: Payer: Self-pay

## 2021-09-18 VITALS — BP 111/73 | Temp 98.2°F | Resp 16 | Wt 143.5 lb

## 2021-09-18 DIAGNOSIS — M545 Low back pain, unspecified: Secondary | ICD-10-CM | POA: Diagnosis not present

## 2021-09-18 DIAGNOSIS — R3915 Urgency of urination: Secondary | ICD-10-CM | POA: Diagnosis not present

## 2021-09-18 LAB — POCT URINALYSIS DIPSTICK
Bilirubin, UA: NEGATIVE
Glucose, UA: NEGATIVE
Ketones, UA: NEGATIVE
Leukocytes, UA: NEGATIVE
Nitrite, UA: NEGATIVE
Protein, UA: NEGATIVE
Spec Grav, UA: 1.02 (ref 1.010–1.025)
Urobilinogen, UA: 0.2 E.U./dL
pH, UA: 6 (ref 5.0–8.0)

## 2021-09-18 MED ORDER — NITROFURANTOIN MONOHYD MACRO 100 MG PO CAPS
100.0000 mg | ORAL_CAPSULE | Freq: Two times a day (BID) | ORAL | 0 refills | Status: AC
  Filled 2021-09-18: qty 10, 5d supply, fill #0

## 2021-09-18 NOTE — Progress Notes (Signed)
?  ? ?I,Joseline E Rosas,acting as a scribe for Shirlee Latch, MD.,have documented all relevant documentation on the behalf of Shirlee Latch, MD,as directed by  Shirlee Latch, MD while in the presence of Shirlee Latch, MD.  ? ?Established patient visit ? ? ?Patient: Lauren Davis   DOB: 03/10/1981   41 y.o. Female  MRN: [id number] ?Visit Date: 09/18/2021 ? ?Today's healthcare provider: Shirlee Latch, MD  ? ?Chief Complaint  ?Patient presents with  ? Back Pain  ? ?Subjective  ?  ?Back Pain ?This is a new problem. The current episode started yesterday. The problem occurs constantly. The pain is present in the lumbar spine. The quality of the pain is described as aching. The pain does not radiate. The pain is mild. Pertinent negatives include no chest pain, fever, headaches, numbness, pelvic pain or weakness. (Urgency to urinate) Treatments tried: took Azo. The treatment provided mild relief.   ? ? ?X3-4 days.  Did UA at work and had trace RBCs, same as today.  AZO seemed to help, but urgency recurred.  Macrobid for same symptoms in Feb relieved it.  No vaginal discharge. ? ?Medications: ?Outpatient Medications Prior to Visit  ?Medication Sig  ? acyclovir (ZOVIRAX) 400 MG tablet Take 1 tablet (400 mg total) by mouth in the morning and at bedtime.  ? cetirizine (ZYRTEC) 10 MG chewable tablet Chew 10 mg by mouth as needed for allergies.  ? drospirenone-ethinyl estradiol (YAZ) 3-0.02 MG tablet TAKE 1 TABLET BY MOUTH DAILY.  ? fexofenadine-pseudoephedrine (ALLEGRA-D 24) 180-240 MG 24 hr tablet Take 1 tablet by mouth daily as needed (allergies).  ? ?No facility-administered medications prior to visit.  ? ? ?Review of Systems  ?Constitutional:  Negative for fever.  ?Cardiovascular:  Negative for chest pain.  ?Genitourinary:  Negative for pelvic pain.  ?Musculoskeletal:  Positive for back pain.  ?Neurological:  Negative for weakness, numbness and headaches.  ? ? ?  Objective  ?  ?BP 111/73 (BP Location:  Right Arm, Patient Position: Sitting, Cuff Size: Normal)   Temp 98.2 ?F (36.8 ?C) (Oral)   Resp 16   Wt 143 lb 8 oz (65.1 kg)   BMI 21.82 kg/m?  ? ? ?Physical Exam ?Vitals reviewed.  ?Constitutional:   ?   General: She is not in acute distress. ?   Appearance: She is well-developed.  ?HENT:  ?   Head: Normocephalic and atraumatic.  ?Eyes:  ?   General: No scleral icterus. ?   Conjunctiva/sclera: Conjunctivae normal.  ?Cardiovascular:  ?   Rate and Rhythm: Normal rate and regular rhythm.  ?   Heart sounds: Normal heart sounds. No murmur heard. ?Pulmonary:  ?   Effort: Pulmonary effort is normal. No respiratory distress.  ?   Breath sounds: Normal breath sounds. No wheezing or rales.  ?Abdominal:  ?   General: There is no distension.  ?   Palpations: Abdomen is soft.  ?   Tenderness: There is no abdominal tenderness. There is no right CVA tenderness, left CVA tenderness, guarding or rebound.  ?Skin: ?   General: Skin is warm and dry.  ?   Capillary Refill: Capillary refill takes less than 2 seconds.  ?Neurological:  ?   Mental Status: She is alert and oriented to person, place, and time.  ?Psychiatric:     ?   Behavior: Behavior normal.  ?  ? ? ?Results for orders placed or performed in visit on 09/18/21  ?POCT urinalysis dipstick  ?Result Value Ref Range  ?  Color, UA light yellow   ? Clarity, UA Clear   ? Glucose, UA Negative Negative  ? Bilirubin, UA Negative   ? Ketones, UA Negative   ? Spec Grav, UA 1.020 1.010 - 1.025  ? Blood, UA Trace   ? pH, UA 6.0 5.0 - 8.0  ? Protein, UA Negative Negative  ? Urobilinogen, UA 0.2 0.2 or 1.0 E.U./dL  ? Nitrite, UA Negative   ? Leukocytes, UA Negative Negative  ? Appearance    ? Odor    ? ? Assessment & Plan  ?  ? ?1. Urinary urgency ?2. Low back pain, unspecified back pain laterality, unspecified chronicity, unspecified whether sciatica present ?- Symptoms and UA consistent with UTI ?- UA mildly positive, but symptoms consistent ?-No systemic symptoms or signs of  pyelonephritis ?- Given hematuria, will send urine micro to confirm and will plan to recheck urine in about 6 weeks after completion of antibiotics to ensure hematuria has cleared ?-Will start treatment with 5 day course of Macrobid - no previous urine culture  ?-We will send urine culture to confirm sensitivities ?-Discussed return precautions  ?- POCT urinalysis dipstick ?- Urine Culture ?- Urine Microscopic  ? ?Return if symptoms worsen or fail to improve.  ?   ? ?I, Shirlee Latch, MD, have reviewed all documentation for this visit. The documentation on 09/18/21 for the exam, diagnosis, procedures, and orders are all accurate and complete. ? ? ?Erasmo Downer, MD, MPH ?[facility] ?[facility]   ?

## 2021-09-19 LAB — URINALYSIS, MICROSCOPIC ONLY
Casts: NONE SEEN /lpf
RBC, Urine: NONE SEEN /hpf (ref 0–2)

## 2021-09-20 LAB — URINE CULTURE: Organism ID, Bacteria: NO GROWTH

## 2021-09-25 ENCOUNTER — Encounter: Payer: Self-pay | Admitting: Family Medicine

## 2021-10-20 ENCOUNTER — Other Ambulatory Visit: Payer: Self-pay

## 2021-12-04 ENCOUNTER — Other Ambulatory Visit: Payer: Self-pay

## 2022-01-10 ENCOUNTER — Other Ambulatory Visit: Payer: Self-pay

## 2022-01-10 ENCOUNTER — Other Ambulatory Visit: Payer: Self-pay | Admitting: Oncology

## 2022-01-10 MED ORDER — NYSTATIN 100000 UNIT/GM EX CREA
1.0000 | TOPICAL_CREAM | Freq: Two times a day (BID) | CUTANEOUS | 0 refills | Status: DC
  Filled 2022-01-10: qty 30, 15d supply, fill #0

## 2022-01-24 ENCOUNTER — Other Ambulatory Visit: Payer: Self-pay | Admitting: Family Medicine

## 2022-01-25 ENCOUNTER — Other Ambulatory Visit: Payer: Self-pay

## 2022-01-25 MED ORDER — DROSPIRENONE-ETHINYL ESTRADIOL 3-0.02 MG PO TABS
1.0000 | ORAL_TABLET | Freq: Every day | ORAL | 9 refills | Status: DC
  Filled 2022-01-25: qty 28, 28d supply, fill #0
  Filled ????-??-??: fill #0

## 2022-02-01 ENCOUNTER — Other Ambulatory Visit: Payer: Self-pay

## 2022-02-14 ENCOUNTER — Encounter: Payer: Self-pay | Admitting: Family Medicine

## 2022-02-14 ENCOUNTER — Other Ambulatory Visit: Payer: Self-pay

## 2022-02-14 MED ORDER — DROSPIRENONE-ETHINYL ESTRADIOL 3-0.02 MG PO TABS
1.0000 | ORAL_TABLET | Freq: Every day | ORAL | 11 refills | Status: DC
  Filled 2022-02-14: qty 84, 63d supply, fill #0
  Filled 2022-05-08: qty 84, 63d supply, fill #1
  Filled 2022-07-05: qty 84, 63d supply, fill #2
  Filled 2022-10-01: qty 84, 63d supply, fill #3

## 2022-05-07 ENCOUNTER — Encounter: Payer: Self-pay | Admitting: Family Medicine

## 2022-05-07 ENCOUNTER — Ambulatory Visit (INDEPENDENT_AMBULATORY_CARE_PROVIDER_SITE_OTHER): Payer: No Typology Code available for payment source | Admitting: Family Medicine

## 2022-05-07 VITALS — BP 107/51 | HR 80 | Temp 98.6°F | Resp 16 | Wt 147.6 lb

## 2022-05-07 DIAGNOSIS — Z1231 Encounter for screening mammogram for malignant neoplasm of breast: Secondary | ICD-10-CM

## 2022-05-07 DIAGNOSIS — Z0000 Encounter for general adult medical examination without abnormal findings: Secondary | ICD-10-CM | POA: Diagnosis not present

## 2022-05-07 NOTE — Progress Notes (Signed)
I,Sulibeya S Dimas,acting as a Education administrator for Lavon Paganini, MD.,have documented all relevant documentation on the behalf of Lavon Paganini, MD,as directed by  Lavon Paganini, MD while in the presence of Lavon Paganini, MD.   Complete physical exam   Patient: Lauren Davis   DOB: 04/26/1981   41 y.o. Female  MRN: [id number] Visit Date: 05/07/2022  Today's healthcare provider: Lavon Paganini, MD   Chief Complaint  Patient presents with   Annual Exam   Subjective    DIMITRA WOODSTOCK is a 41 y.o. female who presents today for a complete physical exam.  She reports consuming a general diet. The patient does not participate in regular exercise at present. She generally feels well. She reports sleeping well. She does not have additional problems to discuss today.  HPI    Past Medical History:  Diagnosis Date   Complication of anesthesia    SLOW TO WAKE UP   Constipation    Dysrhythmia    H/O METOPROLOL 2 YEARS AGO   Fatigue    Gestational diabetes    Gestational Diabetes/ resolved   HPV in female    Vaginal Pap smear, abnormal    Past Surgical History:  Procedure Laterality Date   LAPAROSCOPIC TUBAL LIGATION Bilateral 05/06/2017   Procedure: LAPAROSCOPIC TUBAL LIGATION;  Surgeon: Rubie Maid, MD;  Location: [facility] ORS;  Service: Gynecology;  Laterality: Bilateral;   TONSILLECTOMY  2004   Social History   Socioeconomic History   Marital status: Married    Spouse name: Arianny Pun   Number of children: 2   Years of education: 14   Highest education level: Associate degree: academic program  Occupational History   Occupation: Freight forwarder at [employer] Use   Smoking status: Former    Years: 8.00    Types: Cigarettes    Quit date: 05/20/2008    Years since quitting: 13.9   Smokeless tobacco: Never   Tobacco comments:    former social smoker  Vaping Use   Vaping Use: Never used  Substance and Sexual Activity   Alcohol use: Yes     Alcohol/week: 0.0 standard drinks of alcohol    Comment: occasional   Drug use: No   Sexual activity: Yes    Partners: Male    Birth control/protection: Surgical  Other Topics Concern   Not on file  Social History Narrative   Not on file   Social Determinants of Health   Financial Resource Strain: Not on file  Food Insecurity: Not on file  Transportation Needs: Not on file  Physical Activity: Not on file  Stress: Not on file  Social Connections: Not on file  Intimate Partner Violence: Not on file   Family Status  Relation Name Status   Mother  Alive   MGM  Deceased   MGF  Deceased   PGM  (Not Specified)   PGF  (Not Specified)   Father  Insurance risk surveyor   Daughter  Alive   Daughter  Alive   Neg Hx  (Not Specified)   Family History  Problem Relation Age of Onset   Diabetes Mother    Hyperlipidemia Mother    Heart disease Maternal Grandmother    Diabetes Maternal Grandmother    Diabetes Maternal Grandfather    Heart disease Paternal Grandmother    Heart disease Paternal Grandfather    Hyperlipidemia Father    Hypertension Father    Non-Hodgkin's lymphoma Father    Hypertension Brother  Colon cancer Neg Hx    Breast cancer Neg Hx    Cervical cancer Neg Hx    Allergies  Allergen Reactions   Other Rash    Derma-bond    Sulfa Antibiotics Rash    Patient Care Team: Virginia Crews, MD as PCP - General (Family Medicine)   Medications: Outpatient Medications Prior to Visit  Medication Sig   acyclovir (ZOVIRAX) 400 MG tablet Take 1 tablet (400 mg total) by mouth in the morning and at bedtime.   cetirizine (ZYRTEC) 10 MG chewable tablet Chew 10 mg by mouth as needed for allergies.   drospirenone-ethinyl estradiol (YAZ) 3-0.02 MG tablet TAKE 1 TABLET BY MOUTH DAILY.   fexofenadine-pseudoephedrine (ALLEGRA-D 24) 180-240 MG 24 hr tablet Take 1 tablet by mouth daily as needed (allergies).   nystatin cream (MYCOSTATIN) Apply 1 Application topically 2  (two) times daily.   No facility-administered medications prior to visit.    Review of Systems  All other systems reviewed and are negative.   Last CBC Lab Results  Component Value Date   WBC 6.9 05/05/2021   HGB 14.0 05/05/2021   HCT 41.5 05/05/2021   MCV 93 05/05/2021   MCH 31.3 05/05/2021   RDW 12.5 05/05/2021   PLT 316 38/46/6599   Last metabolic panel Lab Results  Component Value Date   GLUCOSE 93 05/05/2021   NA 141 05/05/2021   K 5.2 05/05/2021   CL 105 05/05/2021   CO2 21 05/05/2021   BUN 9 05/05/2021   CREATININE 0.88 05/05/2021   EGFR 85 05/05/2021   CALCIUM 9.4 05/05/2021   PROT 6.7 05/05/2021   ALBUMIN 4.2 05/05/2021   LABGLOB 2.5 05/05/2021   AGRATIO 1.7 05/05/2021   BILITOT 0.3 05/05/2021   ALKPHOS 47 05/05/2021   AST 16 05/05/2021   ALT 15 05/05/2021   Last lipids Lab Results  Component Value Date   CHOL 192 05/05/2021   HDL 71 05/05/2021   LDLCALC 105 (H) 05/05/2021   TRIG 88 05/05/2021   CHOLHDL 2.7 05/05/2021   Last hemoglobin A1c Lab Results  Component Value Date   HGBA1C 5.3 05/05/2021   Last thyroid functions Lab Results  Component Value Date   TSH 2.600 05/05/2021      Objective    BP (!) 107/51 (BP Location: Left Arm, Patient Position: Sitting, Cuff Size: Normal)   Pulse 80   Temp 98.6 F (37 C) (Oral)   Resp 16   Wt 147 lb 9.6 oz (67 kg)   BMI 22.44 kg/m  BP Readings from Last 3 Encounters:  05/07/22 (!) 107/51  09/18/21 111/73  05/04/21 (!) 117/59   Wt Readings from Last 3 Encounters:  05/07/22 147 lb 9.6 oz (67 kg)  09/18/21 143 lb 8 oz (65.1 kg)  05/04/21 146 lb 9.6 oz (66.5 kg)    Physical Exam Vitals reviewed.  Constitutional:      General: She is not in acute distress.    Appearance: Normal appearance. She is well-developed. She is not diaphoretic.  HENT:     Head: Normocephalic and atraumatic.     Right Ear: Tympanic membrane, ear canal and external ear normal.     Left Ear: Tympanic membrane, ear  canal and external ear normal.     Nose: Nose normal.     Mouth/Throat:     Mouth: Mucous membranes are moist.     Pharynx: Oropharynx is clear. No oropharyngeal exudate.  Eyes:     General: No scleral icterus.  Conjunctiva/sclera: Conjunctivae normal.     Pupils: Pupils are equal, round, and reactive to light.  Neck:     Thyroid: No thyromegaly.  Cardiovascular:     Rate and Rhythm: Normal rate and regular rhythm.     Pulses: Normal pulses.     Heart sounds: Normal heart sounds. No murmur heard. Pulmonary:     Effort: Pulmonary effort is normal. No respiratory distress.     Breath sounds: Normal breath sounds. No wheezing or rales.  Abdominal:     General: There is no distension.     Palpations: Abdomen is soft.     Tenderness: There is no abdominal tenderness.  Musculoskeletal:        General: No deformity.     Cervical back: Neck supple.     Right lower leg: No edema.     Left lower leg: No edema.  Lymphadenopathy:     Cervical: No cervical adenopathy.  Skin:    General: Skin is warm and dry.     Findings: No rash.  Neurological:     Mental Status: She is alert and oriented to person, place, and time. Mental status is at baseline.     Gait: Gait normal.  Psychiatric:        Mood and Affect: Mood normal.        Behavior: Behavior normal.        Thought Content: Thought content normal.       Last depression screening scores    05/07/2022    2:14 PM 09/18/2021    2:34 PM 05/04/2021    4:05 PM  PHQ 2/9 Scores  PHQ - 2 Score 0 0 0  PHQ- 9 Score 0  5   Last fall risk screening    05/07/2022    2:14 PM  [place] in the past year? 0  Number falls in past yr: 0  Injury with Fall? 0  Risk for fall due to : No Fall Risks  Follow up Falls evaluation completed   Last Audit-C alcohol use screening    05/07/2022    2:15 PM  Alcohol Use Disorder Test (AUDIT)  1. How often do you have a drink containing alcohol? 2  2. How many drinks containing alcohol  do you have on a typical day when you are drinking? 0  3. How often do you have six or more drinks on one occasion? 1  AUDIT-C Score 3   A score of 3 or more in women, and 4 or more in men indicates increased risk for alcohol abuse, EXCEPT if all of the points are from question 1   No results found for any visits on 05/07/22.  Assessment & Plan    Routine Health Maintenance and Physical Exam  Exercise Activities and Dietary recommendations  Goals   None     Immunization History  Administered Date(s) Administered   Influenza,inj,Quad PF,6+ Mos 02/26/2017, 03/09/2022   Influenza-Unspecified 03/06/2019, 03/11/2020, 03/16/2021   Janssen (J&J) SARS-COV-2 Vaccination 08/17/2019   PFIZER(Purple Top)SARS-COV-2 Vaccination 04/08/2020   Tdap 06/21/2015    Health Maintenance  Topic Date Due   PAP SMEAR-Modifier  04/29/2024   DTaP/Tdap/Td (2 - Td or Tdap) 06/20/2025   INFLUENZA VACCINE  Completed   Hepatitis C Screening  Completed   HIV Screening  Completed   HPV VACCINES  Aged Out   COVID-19 Vaccine  Discontinued    Discussed health benefits of physical activity, and encouraged her to engage in regular  exercise appropriate for her age and condition.  Problem List Items Addressed This Visit       Other   Encounter for annual physical exam - Primary   Relevant Orders   Lipid panel   Hemoglobin A1c   Comprehensive metabolic panel   CBC   Other Visit Diagnoses     Screening mammogram for breast cancer       Relevant Orders   MM 3D SCREEN BREAST BILATERAL        Return in about 1 year (around 05/08/2023) for CPE.     I, Lavon Paganini, MD, have reviewed all documentation for this visit. The documentation on 05/07/22 for the exam, diagnosis, procedures, and orders are all accurate and complete.   Caren Garske, Dionne Bucy, MD, MPH [facility]

## 2022-05-08 ENCOUNTER — Other Ambulatory Visit: Payer: Self-pay

## 2022-05-08 LAB — COMPREHENSIVE METABOLIC PANEL
ALT: 20 IU/L (ref 0–32)
AST: 17 IU/L (ref 0–40)
Albumin/Globulin Ratio: 1.5 (ref 1.2–2.2)
Albumin: 4.3 g/dL (ref 3.9–4.9)
Alkaline Phosphatase: 40 IU/L — ABNORMAL LOW (ref 44–121)
BUN/Creatinine Ratio: 13 (ref 9–23)
BUN: 13 mg/dL (ref 6–24)
Bilirubin Total: 0.2 mg/dL (ref 0.0–1.2)
CO2: 20 mmol/L (ref 20–29)
Calcium: 9.6 mg/dL (ref 8.7–10.2)
Chloride: 105 mmol/L (ref 96–106)
Creatinine, Ser: 1.01 mg/dL — ABNORMAL HIGH (ref 0.57–1.00)
Globulin, Total: 2.8 g/dL (ref 1.5–4.5)
Glucose: 98 mg/dL (ref 70–99)
Potassium: 5 mmol/L (ref 3.5–5.2)
Sodium: 139 mmol/L (ref 134–144)
Total Protein: 7.1 g/dL (ref 6.0–8.5)
eGFR: 72 mL/min/{1.73_m2} (ref >59)

## 2022-05-08 LAB — CBC
Hematocrit: 42.1 % (ref 34.0–46.6)
Hemoglobin: 13.8 g/dL (ref 11.1–15.9)
MCH: 30.7 pg (ref 26.6–33.0)
MCHC: 32.8 g/dL (ref 31.5–35.7)
MCV: 94 fL (ref 79–97)
Platelets: 328 10*3/uL (ref 150–450)
RBC: 4.5 x10E6/uL (ref 3.77–5.28)
RDW: 11.8 % (ref 11.7–15.4)
WBC: 9 10*3/uL (ref 3.4–10.8)

## 2022-05-08 LAB — LIPID PANEL
Chol/HDL Ratio: 2.6 ratio (ref 0.0–4.4)
Cholesterol, Total: 193 mg/dL (ref 100–199)
HDL: 74 mg/dL (ref >39)
LDL Chol Calc (NIH): 89 mg/dL (ref 0–99)
Triglycerides: 179 mg/dL — ABNORMAL HIGH (ref 0–149)
VLDL Cholesterol Cal: 30 mg/dL (ref 5–40)

## 2022-05-08 LAB — HEMOGLOBIN A1C
Est. average glucose Bld gHb Est-mCnc: 108 mg/dL
Hgb A1c MFr Bld: 5.4 % (ref 4.8–5.6)

## 2022-07-02 ENCOUNTER — Ambulatory Visit
Admission: RE | Admit: 2022-07-02 | Discharge: 2022-07-02 | Disposition: A | Discharge status: Home / Self Care | Payer: 59 | Source: Ambulatory Visit | Attending: Family Medicine | Admitting: Family Medicine

## 2022-07-02 DIAGNOSIS — Z1231 Encounter for screening mammogram for malignant neoplasm of breast: Secondary | ICD-10-CM | POA: Insufficient documentation

## 2022-07-04 ENCOUNTER — Other Ambulatory Visit: Payer: Self-pay | Admitting: Family Medicine

## 2022-07-04 DIAGNOSIS — R928 Other abnormal and inconclusive findings on diagnostic imaging of breast: Secondary | ICD-10-CM

## 2022-07-04 DIAGNOSIS — N6489 Other specified disorders of breast: Secondary | ICD-10-CM

## 2022-07-05 ENCOUNTER — Other Ambulatory Visit: Payer: Self-pay

## 2022-07-06 ENCOUNTER — Other Ambulatory Visit: Payer: Self-pay

## 2022-07-10 ENCOUNTER — Ambulatory Visit
Admission: RE | Admit: 2022-07-10 | Discharge: 2022-07-10 | Disposition: A | Discharge status: Home / Self Care | Payer: 59 | Source: Ambulatory Visit | Attending: Family Medicine | Admitting: Family Medicine

## 2022-07-10 ENCOUNTER — Other Ambulatory Visit: Payer: Self-pay

## 2022-07-10 DIAGNOSIS — R928 Other abnormal and inconclusive findings on diagnostic imaging of breast: Secondary | ICD-10-CM | POA: Diagnosis not present

## 2022-07-10 DIAGNOSIS — N6489 Other specified disorders of breast: Secondary | ICD-10-CM | POA: Insufficient documentation

## 2022-07-10 DIAGNOSIS — R922 Inconclusive mammogram: Secondary | ICD-10-CM | POA: Diagnosis not present

## 2022-10-01 ENCOUNTER — Other Ambulatory Visit: Payer: Self-pay

## 2022-12-11 ENCOUNTER — Other Ambulatory Visit: Payer: Self-pay | Admitting: Family Medicine

## 2022-12-11 DIAGNOSIS — N6489 Other specified disorders of breast: Secondary | ICD-10-CM

## 2022-12-12 ENCOUNTER — Other Ambulatory Visit: Payer: Self-pay | Admitting: Family Medicine

## 2022-12-13 ENCOUNTER — Other Ambulatory Visit: Payer: Self-pay

## 2022-12-13 MED ORDER — DROSPIRENONE-ETHINYL ESTRADIOL 3-0.02 MG PO TABS
1.0000 | ORAL_TABLET | Freq: Every day | ORAL | 3 refills | Status: DC
  Filled 2022-12-13: qty 84, 84d supply, fill #0
  Filled 2023-02-24: qty 28, 28d supply, fill #1

## 2022-12-13 NOTE — Telephone Encounter (Signed)
Requested Prescriptions  Pending Prescriptions Disp Refills   drospirenone-ethinyl estradiol (JASMIEL) 3-0.02 MG tablet 28 tablet 3    Sig: TAKE 1 TABLET BY MOUTH DAILY.     OB/GYN:  Contraceptives Passed - 12/12/2022  8:30 AM      Passed - Last BP in normal range    BP Readings from Last 1 Encounters:  05/07/22 (!) 107/51         Passed - Valid encounter within last 12 months    Recent Outpatient Visits           7 months ago Encounter for annual physical exam   [place] [facility] [place], Marzella Schlein, MD   1 year ago Urinary urgency   [place] [facility] [place], Marzella Schlein, MD   1 year ago Encounter for annual physical exam   [place] [facility] [place], Marzella Schlein, MD   2 years ago Encounter for annual physical exam   [place] [facility] [place], Marzella Schlein, MD   3 years ago Annual physical exam   [place] [facility] [place], Marzella Schlein, MD       Future Appointments             In 4 months Bacigalupo, Marzella Schlein, MD [facility], [facility] [place]            Passed - Patient is not a smoker

## 2023-01-11 ENCOUNTER — Ambulatory Visit
Admission: RE | Admit: 2023-01-11 | Discharge: 2023-01-11 | Disposition: A | Discharge status: Home / Self Care | Payer: 59 | Source: Ambulatory Visit | Attending: Family Medicine | Admitting: Family Medicine

## 2023-01-11 DIAGNOSIS — R928 Other abnormal and inconclusive findings on diagnostic imaging of breast: Secondary | ICD-10-CM | POA: Diagnosis not present

## 2023-01-11 DIAGNOSIS — N6489 Other specified disorders of breast: Secondary | ICD-10-CM | POA: Diagnosis not present

## 2023-01-11 DIAGNOSIS — R92333 Mammographic heterogeneous density, bilateral breasts: Secondary | ICD-10-CM | POA: Diagnosis not present

## 2023-01-29 ENCOUNTER — Other Ambulatory Visit: Payer: Self-pay

## 2023-02-12 ENCOUNTER — Other Ambulatory Visit: Payer: Self-pay | Admitting: Family Medicine

## 2023-02-12 ENCOUNTER — Other Ambulatory Visit: Payer: Self-pay

## 2023-02-13 ENCOUNTER — Other Ambulatory Visit: Payer: Self-pay

## 2023-02-13 MED FILL — Acyclovir Tab 400 MG: ORAL | 90 days supply | Qty: 180 | Fill #0 | Status: AC

## 2023-02-13 NOTE — Telephone Encounter (Signed)
Requested Prescriptions  Pending Prescriptions Disp Refills   acyclovir (ZOVIRAX) 400 MG tablet 180 tablet 0    Sig: Take 1 tablet (400 mg total) by mouth in the morning and at bedtime.     Antimicrobials:  Antiviral Agents - Anti-Herpetic Passed - 02/12/2023  5:53 PM      Passed - Valid encounter within last 12 months    Recent Outpatient Visits           9 months ago Encounter for annual physical exam   [place] [facility] [place], Marzella Schlein, MD   1 year ago Urinary urgency   [place] [facility] [place], Marzella Schlein, MD   1 year ago Encounter for annual physical exam   [place] [facility] Er[facility]o Downer, MD   2 years ago Encounter for annual physical exam   [place] [facility] [place], Marzella Schlein, MD   3 years ago Annual physical exam    [place] [facility] [place], Marzella Schlein, MD       Future Appointments             In 2 months Bacigalupo, Marzella Schlein, MD [facility], [facility]

## 2023-02-14 ENCOUNTER — Other Ambulatory Visit: Payer: Self-pay

## 2023-02-24 ENCOUNTER — Other Ambulatory Visit: Payer: Self-pay

## 2023-02-26 ENCOUNTER — Other Ambulatory Visit: Payer: Self-pay

## 2023-03-20 ENCOUNTER — Other Ambulatory Visit: Payer: Self-pay

## 2023-03-20 ENCOUNTER — Other Ambulatory Visit: Payer: Self-pay | Admitting: Family Medicine

## 2023-03-20 MED ORDER — DROSPIRENONE-ETHINYL ESTRADIOL 3-0.02 MG PO TABS
1.0000 | ORAL_TABLET | Freq: Every day | ORAL | 3 refills | Status: DC
  Filled 2023-03-20: qty 112, 84d supply, fill #0

## 2023-03-20 NOTE — Telephone Encounter (Signed)
Requested Prescriptions  Pending Prescriptions Disp Refills   drospirenone-ethinyl estradiol (JASMIEL) 3-0.02 MG tablet 28 tablet 3    Sig: Take 1 tablet by mouth daily. Takes continuously.     OB/GYN:  Contraceptives Passed - 03/20/2023  7:49 AM      Passed - Last BP in normal range    BP Readings from Last 1 Encounters:  05/07/22 (!) 107/51         Passed - Valid encounter within last 12 months    Recent Outpatient Visits           10 months ago Encounter for annual physical exam   [place] [facility] [place], Marzella Schlein, MD   1 year ago Urinary urgency   [place] [facility] [place], Marzella Schlein, MD   1 year ago Encounter for annual physical exam   [place] [facility] [place], Marzella Schlein, MD   2 years ago Encounter for annual physical exam   [place] [facility] [place], Marzella Schlein, MD   3 years ago Annual physical exam   [place] [facility] [place], Marzella Schlein, MD       Future Appointments             In 1 month Bacigalupo, Marzella Schlein, MD [facility], [facility]            Passed - Patient is not a smoker

## 2023-05-09 ENCOUNTER — Other Ambulatory Visit: Payer: Self-pay

## 2023-05-09 ENCOUNTER — Encounter: Payer: Self-pay | Admitting: Family Medicine

## 2023-05-09 ENCOUNTER — Ambulatory Visit (INDEPENDENT_AMBULATORY_CARE_PROVIDER_SITE_OTHER): Payer: 59 | Admitting: Family Medicine

## 2023-05-09 VITALS — BP 121/73 | HR 88 | Ht 68.0 in | Wt 146.4 lb

## 2023-05-09 DIAGNOSIS — K219 Gastro-esophageal reflux disease without esophagitis: Secondary | ICD-10-CM | POA: Diagnosis not present

## 2023-05-09 DIAGNOSIS — Z0000 Encounter for general adult medical examination without abnormal findings: Secondary | ICD-10-CM | POA: Diagnosis not present

## 2023-05-09 MED ORDER — OMEPRAZOLE 20 MG PO CPDR
20.0000 mg | DELAYED_RELEASE_CAPSULE | Freq: Every day | ORAL | 3 refills | Status: AC
Start: 2023-05-09 — End: ?
  Filled 2023-05-09: qty 90, 90d supply, fill #0

## 2023-05-09 NOTE — Progress Notes (Signed)
Complete physical exam   Patient: Lauren Davis   DOB: 08-Feb-1981   42 y.o. Female  MRN: [id number] Visit Date: 05/09/2023  Today's healthcare provider: Shirlee Latch, MD   Chief Complaint  Patient presents with   Annual Exam    Last CPE 05/07/22 -Diet: general, well balanced  -Exercise: none -Feeling: well -Sleeping: fairly well -Concerns: Pre-menopause, family hx of it starting around 40's on maternal side, reflux X 6 months has become worse. Was being maintained with pepcid but it is beginning to affect every meal and even drinks. Previously tried omeprazole 20 mg tablet X 2 weeks from otc and it worked wonders   Subjective    Lauren Davis is a 42 y.o. female who presents today for a complete physical exam.    Discussed the use of AI scribe software for clinical note transcription with the patient, who gave verbal consent to proceed.  History of Present Illness   The patient, who is currently on continuous birth control, presents with worsening reflux over the past couple of months. They have tried omeprazole, which seems to alleviate the symptoms, but they often forget to take it. The patient reports that certain foods and red wine exacerbate the reflux. They also mention experiencing hot flashes and mood swings, which they suspect might be related to menopause. The patient's mother and other female relatives reportedly started menopause around the age of 31, which is the patient's current age. The patient also reports occasional breakthrough bleeding, but not enough to require a panty liner or pad.       Last depression screening scores    05/07/2022    2:14 PM 09/18/2021    2:34 PM 05/04/2021    4:05 PM  PHQ 2/9 Scores  PHQ - 2 Score 0 0 0  PHQ- 9 Score 0  5   Last fall risk screening    05/07/2022    2:14 PM  Fall Risk   Falls in the past year? 0  Number falls in past yr: 0  Injury with Fall? 0  Risk for fall due to : No Fall Risks  Follow up Falls  evaluation completed        Medications: Outpatient Medications Prior to Visit  Medication Sig   acyclovir (ZOVIRAX) 400 MG tablet Take 1 tablet (400 mg total) by mouth in the morning and at bedtime.   cetirizine (ZYRTEC) 10 MG chewable tablet Chew 10 mg by mouth as needed for allergies.   drospirenone-ethinyl estradiol (JASMIEL) 3-0.02 MG tablet Take 1 tablet by mouth daily. Takes continuously.   fexofenadine-pseudoephedrine (ALLEGRA-D 24) 180-240 MG 24 hr tablet Take 1 tablet by mouth daily as needed (allergies).   [DISCONTINUED] nystatin cream (MYCOSTATIN) Apply 1 Application topically 2 (two) times daily. (Patient not taking: Reported on 05/09/2023)   No facility-administered medications prior to visit.    Review of Systems    Objective    BP 121/73 (BP Location: Left Arm, Patient Position: Sitting, Cuff Size: Normal)   Pulse 88   Ht 5\' 8"  (1.727 m)   Wt 146 lb 6.4 oz (66.4 kg)   SpO2 98%   BMI 22.26 kg/m    Physical Exam Vitals reviewed.  Constitutional:      General: She is not in acute distress.    Appearance: Normal appearance. She is well-developed. She is not diaphoretic.  HENT:     Head: Normocephalic and atraumatic.     Right Ear: Tympanic membrane, ear canal and  external ear normal.     Left Ear: Tympanic membrane, ear canal and external ear normal.     Nose: Nose normal.     Mouth/Throat:     Mouth: Mucous membranes are moist.     Pharynx: Oropharynx is clear. No oropharyngeal exudate.  Eyes:     General: No scleral icterus.    Conjunctiva/sclera: Conjunctivae normal.     Pupils: Pupils are equal, round, and reactive to light.  Neck:     Thyroid: No thyromegaly.  Cardiovascular:     Rate and Rhythm: Normal rate and regular rhythm.     Heart sounds: Normal heart sounds. No murmur heard. Pulmonary:     Effort: Pulmonary effort is normal. No respiratory distress.     Breath sounds: Normal breath sounds. No wheezing or rales.  Abdominal:      General: There is no distension.     Palpations: Abdomen is soft.     Tenderness: There is no abdominal tenderness.  Musculoskeletal:        General: No deformity.     Cervical back: Neck supple.     Right lower leg: No edema.     Left lower leg: No edema.  Lymphadenopathy:     Cervical: No cervical adenopathy.  Skin:    General: Skin is warm and dry.     Findings: No rash.  Neurological:     Mental Status: She is alert and oriented to person, place, and time. Mental status is at baseline.     Gait: Gait normal.  Psychiatric:        Mood and Affect: Mood normal.        Behavior: Behavior normal.        Thought Content: Thought content normal.      No results found for any visits on 05/09/23.  Assessment & Plan    Routine Health Maintenance and Physical Exam  Exercise Activities and Dietary recommendations  Goals   None     Immunization History  Administered Date(s) Administered   Influenza,inj,Quad PF,6+ Mos 02/26/2017, 03/09/2022   Influenza-Unspecified 03/06/2019, 03/11/2020, 03/16/2021, 03/08/2023   Janssen (J&J) SARS-COV-2 Vaccination 08/17/2019   PFIZER(Purple Top)SARS-COV-2 Vaccination 04/08/2020   Tdap 06/21/2015    Health Maintenance  Topic Date Due   Cervical Cancer Screening (HPV/Pap Cot[place])  04/29/2024   DTaP/Tdap/Td (2 - Td or Tdap) 06/20/2025   INFLUENZA VACCINE  Completed   Hepatitis C Screening  Completed   HIV Screening  Completed   HPV VACCINES  Aged Out   COVID-19 Vaccine  Discontinued    Discussed health benefits of physical activity, and encouraged her to engage in regular exercise appropriate for her age and condition.  Problem List Items Addressed This Visit       Digestive   Gastroesophageal reflux disease without esophagitis   Relevant Medications   omeprazole (PRILOSEC) 20 MG capsule     Other   Encounter for annual physical exam - Primary   Relevant Medications   omeprazole (PRILOSEC) 20 MG capsule   Other Relevant Orders    Comprehensive metabolic panel   Lipid Panel With LDL/HDL Ratio   CBC w/Diff/Platelet   TSH   Hemoglobin A1c        Gastroesophageal Reflux Disease (GERD) Chronic reflux symptoms worsening over the past few months. Omeprazole 20 mg daily has been effective, but symptoms recur when not taken. Discussed risks of long-term PPI use, including potential impacts on calcium absorption and bone health, versus risks of untreated chronic  reflux, such as esophageal damage and precancerous changes. Emphasized the importance of the lowest effective dose for the shortest duration possible. - Prescribe omeprazole 20 mg daily  Perimenopausal Symptoms Reports hot flashes and mood swings, consistent with family history of early menopause. Currently on continuous birth control, which helps manage symptoms and benefits bone health. No changes to current management plan. - Continue current birth control regimen  General Health Maintenance Up to date on flu shot and tetanus vaccine (next due 2027). Pap smear due next year. Routine labs including A1c, cholesterol, kidney and liver function, thyroid, and blood counts are due. - Order routine labs including A1c, cholesterol, kidney and liver function, thyroid, and blood counts - Schedule Pap smear for next year  Follow-up - Send lab results via patient portal - Schedule next annual visit.         Return in about 1 year (around 05/08/2024) for CPE.     Shirlee Latch, MD  [facility] [phone] (phone) [phone] (fax)  [facility]

## 2023-05-10 LAB — CBC WITH DIFFERENTIAL/PLATELET
Basophils Absolute: 0.1 10*3/uL (ref 0.0–0.2)
Basos: 1 %
EOS (ABSOLUTE): 0.4 10*3/uL (ref 0.0–0.4)
Eos: 4 %
Hematocrit: 42.2 % (ref 34.0–46.6)
Hemoglobin: 13.7 g/dL (ref 11.1–15.9)
Immature Grans (Abs): 0 10*3/uL (ref 0.0–0.1)
Immature Granulocytes: 0 %
Lymphocytes Absolute: 2.9 10*3/uL (ref 0.7–3.1)
Lymphs: 34 %
MCH: 30.2 pg (ref 26.6–33.0)
MCHC: 32.5 g/dL (ref 31.5–35.7)
MCV: 93 fL (ref 79–97)
Monocytes Absolute: 0.5 10*3/uL (ref 0.1–0.9)
Monocytes: 6 %
Neutrophils Absolute: 4.8 10*3/uL (ref 1.4–7.0)
Neutrophils: 55 %
Platelets: 309 10*3/uL (ref 150–450)
RBC: 4.53 x10E6/uL (ref 3.77–5.28)
RDW: 12 % (ref 11.7–15.4)
WBC: 8.8 10*3/uL (ref 3.4–10.8)

## 2023-05-10 LAB — COMPREHENSIVE METABOLIC PANEL
ALT: 17 [IU]/L (ref 0–32)
AST: 13 [IU]/L (ref 0–40)
Albumin: 4.2 g/dL (ref 3.9–4.9)
Alkaline Phosphatase: 45 [IU]/L (ref 44–121)
BUN/Creatinine Ratio: 19 (ref 9–23)
BUN: 15 mg/dL (ref 6–24)
Bilirubin Total: 0.2 mg/dL (ref 0.0–1.2)
CO2: 19 mmol/L — ABNORMAL LOW (ref 20–29)
Calcium: 9.6 mg/dL (ref 8.7–10.2)
Chloride: 106 mmol/L (ref 96–106)
Creatinine, Ser: 0.78 mg/dL (ref 0.57–1.00)
Globulin, Total: 2.8 g/dL (ref 1.5–4.5)
Glucose: 94 mg/dL (ref 70–99)
Potassium: 5.1 mmol/L (ref 3.5–5.2)
Sodium: 137 mmol/L (ref 134–144)
Total Protein: 7 g/dL (ref 6.0–8.5)
eGFR: 97 mL/min/{1.73_m2} (ref >59)

## 2023-05-10 LAB — LIPID PANEL WITH LDL/HDL RATIO
Cholesterol, Total: 190 mg/dL (ref 100–199)
HDL: 76 mg/dL (ref >39)
LDL Chol Calc (NIH): 97 mg/dL (ref 0–99)
LDL/HDL Ratio: 1.3 {ratio} (ref 0.0–3.2)
Triglycerides: 98 mg/dL (ref 0–149)
VLDL Cholesterol Cal: 17 mg/dL (ref 5–40)

## 2023-05-10 LAB — HEMOGLOBIN A1C
Est. average glucose Bld gHb Est-mCnc: 111 mg/dL
Hgb A1c MFr Bld: 5.5 % (ref 4.8–5.6)

## 2023-05-10 LAB — TSH: TSH: 1.81 u[IU]/mL (ref 0.450–4.500)

## 2023-06-26 ENCOUNTER — Other Ambulatory Visit: Payer: Self-pay | Admitting: Family Medicine

## 2023-06-26 DIAGNOSIS — Z1231 Encounter for screening mammogram for malignant neoplasm of breast: Secondary | ICD-10-CM

## 2023-06-26 DIAGNOSIS — N6489 Other specified disorders of breast: Secondary | ICD-10-CM

## 2023-07-01 ENCOUNTER — Other Ambulatory Visit: Payer: Self-pay | Admitting: Family Medicine

## 2023-07-01 ENCOUNTER — Other Ambulatory Visit: Payer: Self-pay

## 2023-07-02 ENCOUNTER — Other Ambulatory Visit: Payer: Self-pay

## 2023-07-02 MED FILL — Drospirenone-Ethinyl Estradiol Tab 3-0.02 MG: ORAL | 28 days supply | Qty: 28 | Fill #0 | Status: AC

## 2023-07-02 NOTE — Telephone Encounter (Signed)
Requested Prescriptions  Pending Prescriptions Disp Refills   JASMIEL 3-0.02 MG tablet [Pharmacy Med Name: drospirenone-ethinyl estradiol (JASMIEL) 3-0.02 MG tablet] 28 tablet 3    Sig: Take 1 tablet by mouth daily. Takes continuously.     OB/GYN:  Contraceptives Passed - 07/02/2023  2:50 PM      Passed - Last BP in normal range    BP Readings from Last 1 Encounters:  05/09/23 121/73         Passed - Valid encounter within last 12 months    Recent Outpatient Visits           1 month ago Encounter for annual physical exam   [place] [facility] [place], Marzella Schlein, MD   1 year ago Encounter for annual physical exam   [place] [facility] [place], Marzella Schlein, MD   1 year ago Urinary urgency   [place] [facility] [place], Marzella Schlein, MD   2 years ago Encounter for annual physical exam   [place] [facility] [place], Marzella Schlein, MD   3 years ago Encounter for annual physical exam    [place] [facility] [place], Marzella Schlein, MD       Future Appointments             In 10 months Bacigalupo, Marzella Schlein, MD [facility], [facility]            Passed - Patient is not a smoker

## 2023-07-25 MED FILL — Drospirenone-Ethinyl Estradiol Tab 3-0.02 MG: ORAL | 28 days supply | Qty: 28 | Fill #1 | Status: AC

## 2023-08-19 MED FILL — Drospirenone-Ethinyl Estradiol Tab 3-0.02 MG: ORAL | 28 days supply | Qty: 28 | Fill #2 | Status: AC

## 2023-08-20 ENCOUNTER — Other Ambulatory Visit: Payer: Self-pay

## 2023-08-20 ENCOUNTER — Encounter: Payer: Self-pay | Admitting: Family Medicine

## 2023-09-13 MED FILL — Drospirenone-Ethinyl Estradiol Tab 3-0.02 MG: ORAL | 28 days supply | Qty: 28 | Fill #3 | Status: AC

## 2023-10-08 ENCOUNTER — Other Ambulatory Visit: Payer: Self-pay | Admitting: Family Medicine

## 2023-10-09 ENCOUNTER — Other Ambulatory Visit: Payer: Self-pay

## 2023-10-09 ENCOUNTER — Other Ambulatory Visit: Payer: Self-pay | Admitting: Family Medicine

## 2023-10-09 ENCOUNTER — Encounter: Payer: Self-pay | Admitting: Family Medicine

## 2023-10-09 MED ORDER — DROSPIRENONE-ETHINYL ESTRADIOL 3-0.02 MG PO TABS
1.0000 | ORAL_TABLET | Freq: Every day | ORAL | 3 refills | Status: DC
  Filled 2023-10-09: qty 84, 63d supply, fill #0
  Filled 2023-12-21: qty 28, 21d supply, fill #1

## 2023-10-26 ENCOUNTER — Other Ambulatory Visit: Payer: Self-pay | Admitting: Family Medicine

## 2023-10-28 ENCOUNTER — Other Ambulatory Visit: Payer: Self-pay

## 2023-10-28 MED ORDER — ACYCLOVIR 400 MG PO TABS
400.0000 mg | ORAL_TABLET | Freq: Two times a day (BID) | ORAL | 0 refills | Status: AC
  Filled 2023-10-28: qty 180, 90d supply, fill #0

## 2023-12-22 ENCOUNTER — Other Ambulatory Visit: Payer: Self-pay

## 2024-01-15 ENCOUNTER — Other Ambulatory Visit: Payer: Self-pay

## 2024-01-15 ENCOUNTER — Other Ambulatory Visit: Payer: Self-pay | Admitting: Family Medicine

## 2024-01-15 MED ORDER — DROSPIRENONE-ETHINYL ESTRADIOL 3-0.02 MG PO TABS
1.0000 | ORAL_TABLET | Freq: Every day | ORAL | 1 refills | Status: DC
  Filled 2024-01-15: qty 84, 63d supply, fill #0
  Filled 2024-03-14: qty 84, 63d supply, fill #1

## 2024-03-04 ENCOUNTER — Other Ambulatory Visit: Payer: Self-pay

## 2024-03-04 MED ORDER — PROMETHAZINE HCL 25 MG/ML IJ SOLN: 50.0000 mg | INTRAMUSCULAR | 1 refills | Status: DC | PRN

## 2024-03-27 ENCOUNTER — Telehealth: Payer: Self-pay

## 2024-03-27 ENCOUNTER — Other Ambulatory Visit: Payer: Self-pay | Admitting: Adult Health

## 2024-03-27 MED ORDER — ALBUTEROL SULFATE HFA 108 (90 BASE) MCG/ACT IN AERS: 2.0000 | INHALATION_SPRAY | Freq: Four times a day (QID) | RESPIRATORY_TRACT | 0 refills | Status: AC | PRN

## 2024-03-27 NOTE — Telephone Encounter (Signed)
 Copied from CRM [id number]. Topic: Clinical - Prescription Issue >> Mar 27, 2024 11:37 AM Myrick T wrote: Reason for CRM: patient stated the med  promethazine  (PHENERGAN ) 25 MG/ML injection  Was prescribed to her but she has an aunt with the same name middle initial W that suffers from migraines and that should be on the aunts chart who sees Dr Gasper and not med list. Patient is asking that it be removed. Please f/u with patient

## 2024-03-30 ENCOUNTER — Other Ambulatory Visit: Payer: Self-pay | Admitting: Medical

## 2024-03-30 DIAGNOSIS — J019 Acute sinusitis, unspecified: Secondary | ICD-10-CM

## 2024-03-30 MED ORDER — DOXYCYCLINE HYCLATE 100 MG PO TABS: 100.0000 mg | ORAL_TABLET | Freq: Two times a day (BID) | ORAL | 0 refills | Status: AC

## 2024-03-30 MED ORDER — AMOXICILLIN-POT CLAVULANATE 875-125 MG PO TABS: 1.0000 | ORAL_TABLET | Freq: Two times a day (BID) | ORAL | 0 refills | Status: DC

## 2024-03-30 MED ORDER — FLUCONAZOLE 150 MG PO TABS: 150.0000 mg | ORAL_TABLET | Freq: Once | ORAL | 0 refills | Status: DC | PRN

## 2024-03-30 NOTE — Progress Notes (Signed)
 Patient reports respiratory sx x 4 weeks, not improving. Current sx include head/sinus congestion/pressure, nasal discharge, postnasal drainage, ear pressure, difficulty hearing, sore throat (in AM mainly), productive cough. No recent fever. Cough productive of brown mucus. Taking Allegra-D, Mucinex, albuterol inhaler. Also recently prescribed prednisone taper (on day 5 of 6).   Mild erythema to lateral aspects of posterior pharynx. Large serous middle ear effusions bilaterally. No TM erythema, perhaps slight bulging.  Given persistence of sx without significant improvement, will trial antibiotic. Gave Diflucan  at patient request to take PRN yeast infection after abx.

## 2024-03-30 NOTE — Progress Notes (Unsigned)
 Patient reports family hx of allergy to Amoxicillin, would prefer to avoid this. Will switch to Doxycycline.

## 2024-03-30 NOTE — Telephone Encounter (Signed)
 Looks like this was d/c'd today by Luke Arts. CMAs - can let patient know that it is no longer in her chart.  Dr Gasper - want to make sure that you did get this ordered for the right patient also.

## 2024-03-31 NOTE — Telephone Encounter (Signed)
 Detailed VM left per DPR. Ok for [employer] and/or triage to advise per Dr.B in regards to RX

## 2024-05-07 ENCOUNTER — Encounter: Payer: Self-pay | Admitting: Family Medicine

## 2024-05-07 ENCOUNTER — Ambulatory Visit: Admitting: Family Medicine

## 2024-05-07 VITALS — BP 128/80 | HR 87 | Resp 16 | Ht 68.0 in | Wt 146.4 lb

## 2024-05-07 DIAGNOSIS — R296 Repeated falls: Secondary | ICD-10-CM

## 2024-05-07 DIAGNOSIS — R29818 Other symptoms and signs involving the nervous system: Secondary | ICD-10-CM

## 2024-05-07 DIAGNOSIS — R5382 Chronic fatigue, unspecified: Secondary | ICD-10-CM

## 2024-05-07 DIAGNOSIS — F419 Anxiety disorder, unspecified: Secondary | ICD-10-CM

## 2024-05-07 DIAGNOSIS — R42 Dizziness and giddiness: Secondary | ICD-10-CM | POA: Diagnosis not present

## 2024-05-07 DIAGNOSIS — Z820 Family history of epilepsy and other diseases of the nervous system: Secondary | ICD-10-CM

## 2024-05-07 DIAGNOSIS — R252 Cramp and spasm: Secondary | ICD-10-CM

## 2024-05-07 NOTE — Progress Notes (Signed)
 Acute visit   Patient: Lauren Davis   DOB: 10/18/1980   43 y.o. Female  MRN: [id number] PCP: Myrla Jon HERO, MD   Chief Complaint  Patient presents with   Dizziness   Fatigue   Subjective    Discussed the use of AI scribe software for clinical note transcription with the patient, who gave verbal consent to proceed.  History of Present Illness   Lauren Davis is a 43 year old female who presents with dizziness, fatigue, and muscle cramps.  She has brief dizziness triggered by movement, such as turning her head or riding in a car. Each episode lasts a split second but leaves her feeling unwell and fatigued for the rest of the day. Symptoms have occurred sporadically for several years, starting before her pregnancy, and are increasing in frequency. Episodes occur at any time and are not clearly related to diet or activities. Prior vertigo and ENT evaluations were negative.  She has significant fatigue even on days without dizziness and feels too tired to function during episodes. She also has intermittent muscle cramps in her legs and feet and aching in her arms, without clear activity or dietary triggers.  She has a family history of ALS in her grandfather and is worried her symptoms could be related. She notes muscle cramps, occasional numbness in her toes, dropping objects, and several falls and tripping incidents over the past year.  She has emotional distress with crying spells and anxiety that she links to her physical symptoms. She previously took Prozac in her early twenties but stopped due to worsening depression.  She has dry eyes and occasional floaters but no significant vision problems and has regular eye exams.          05/07/2024    9:17 AM  GAD 7 : Generalized Anxiety Score  Nervous, Anxious, on Edge 2  Control/stop worrying 3  Worry too much - different things 2  Trouble relaxing 2  Restless 2  Easily annoyed or irritable 2  Afraid - awful might  happen 3  Total GAD 7 Score 16  Anxiety Difficulty Somewhat difficult       05/07/2024    9:16 AM 05/07/2022    2:14 PM 09/18/2021    2:34 PM 05/04/2021    4:05 PM 05/02/2020    3:22 PM  Depression screen PHQ 2/9  Decreased Interest 0 0 0 0 0  Down, Depressed, Hopeless 1 0 0 0 0  PHQ - 2 Score 1 0 0 0 0  Altered sleeping 3 0  3 2  Tired, decreased energy 3 0  2 1  Change in appetite 0 0  0 0  Feeling bad or failure about yourself  1 0   0  Trouble concentrating 0 0  0 0  Moving slowly or fidgety/restless 1 0  0 0  Suicidal thoughts 0 0  0 0  PHQ-9 Score 9 0   5  3   Difficult doing work/chores Somewhat difficult Not difficult at all  Not difficult at all Not difficult at all     Data saved with a previous flowsheet row definition     Review of Systems  Objective    BP 128/80 (BP Location: Right Arm, Patient Position: Sitting, Cuff Size: Normal)   Pulse 87   Resp 16   Ht 5' 8 (1.727 m)   Wt 146 lb 6.4 oz (66.4 kg)   SpO2 97%   BMI 22.26 kg/m  Orthostatic Vitals for the past 48 hrs (Last 6 readings):  Patient Position BP Pulse BP Location Cuff Size Patient Position (if appropriate) BP- Standing at 0 minutes Pulse- Standing at 0 minutes BP- Sitting Pulse- Sitting BP- Lying Pulse- Lying  05/07/24 0914 Sitting 128/80 87 Right Arm Normal -- -- -- -- -- -- --  05/07/24 0921 -- -- -- -- -- Orthostatic Vitals 127/83 95 128/86 83 125/82 82    Physical Exam Vitals reviewed.  Constitutional:      General: She is not in acute distress.    Appearance: Normal appearance. She is well-developed. She is not diaphoretic.  HENT:     Head: Normocephalic and atraumatic.  Eyes:     General: No scleral icterus.    Conjunctiva/sclera: Conjunctivae normal.  Neck:     Thyroid : No thyromegaly.  Cardiovascular:     Rate and Rhythm: Normal rate and regular rhythm.     Pulses: Normal pulses.     Heart sounds: Normal heart sounds. No murmur heard. Pulmonary:     Effort: Pulmonary  effort is normal. No respiratory distress.     Breath sounds: Normal breath sounds. No wheezing, rhonchi or rales.  Musculoskeletal:     Cervical back: Neck supple.     Right lower leg: No edema.     Left lower leg: No edema.  Lymphadenopathy:     Cervical: No cervical adenopathy.  Skin:    General: Skin is warm and dry.  Neurological:     Mental Status: She is alert and oriented to person, place, and time. Mental status is at baseline.     Cranial Nerves: No cranial nerve deficit.     Sensory: No sensory deficit.     Motor: No weakness.     Coordination: Coordination normal.     Gait: Gait normal.  Psychiatric:        Mood and Affect: Mood normal.        Behavior: Behavior normal.       No results found for any visits on 05/07/24.  Assessment & Plan     Problem List Items Addressed This Visit       Other   Chronic fatigue - Primary   Relevant Orders   Comprehensive metabolic panel with GFR   Magnesium   CBC with Differential/Platelet   Vitamin B12   VITAMIN D  25 Hydroxy (Vit-D Deficiency, Fractures)   TSH   Sed Rate (ESR)   CRP High sensitivity   CK (Creatine Kinase)   Ambulatory referral to Neurology   Iron, TIBC and Ferritin Panel   Other Visit Diagnoses       Neurologic abnormality       Relevant Orders   Comprehensive metabolic panel with GFR   Magnesium   CBC with Differential/Platelet   Vitamin B12   VITAMIN D  25 Hydroxy (Vit-D Deficiency, Fractures)   TSH   Sed Rate (ESR)   CRP High sensitivity   CK (Creatine Kinase)   Ambulatory referral to Neurology   Iron, TIBC and Ferritin Panel     Family hx of ALS (amyotrophic lateral sclerosis)       Relevant Orders   Comprehensive metabolic panel with GFR   Magnesium   CBC with Differential/Platelet   Vitamin B12   VITAMIN D  25 Hydroxy (Vit-D Deficiency, Fractures)   TSH   Sed Rate (ESR)   CRP High sensitivity   CK (Creatine Kinase)   Ambulatory referral to Neurology   Iron, TIBC and Ferritin  Panel     Dizziness       Relevant Orders   Comprehensive metabolic panel with GFR   Magnesium   CBC with Differential/Platelet   Vitamin B12   VITAMIN D  25 Hydroxy (Vit-D Deficiency, Fractures)   TSH   Sed Rate (ESR)   CRP High sensitivity   CK (Creatine Kinase)   Ambulatory referral to Neurology   Iron, TIBC and Ferritin Panel     Muscle cramping       Relevant Orders   Comprehensive metabolic panel with GFR   Magnesium   CBC with Differential/Platelet   Vitamin B12   VITAMIN D  25 Hydroxy (Vit-D Deficiency, Fractures)   TSH   Sed Rate (ESR)   CRP High sensitivity   CK (Creatine Kinase)   Ambulatory referral to Neurology   Iron, TIBC and Ferritin Panel     Recurrent falls       Relevant Orders   Comprehensive metabolic panel with GFR   Magnesium   CBC with Differential/Platelet   Vitamin B12   VITAMIN D  25 Hydroxy (Vit-D Deficiency, Fractures)   TSH   Sed Rate (ESR)   CRP High sensitivity   CK (Creatine Kinase)   Ambulatory referral to Neurology   Iron, TIBC and Ferritin Panel     Anxiety-like symptoms               Evaluation of chronic fatigue and multisystem neurologic symptoms Chronic fatigue and multisystem neurologic symptoms, including dizziness, muscle cramps, and falls, with a family history of ALS. Symptoms are intermittent and not clearly linked to specific activities or dietary changes. Differential diagnosis includes vitamin deficiency, thyroid  dysfunction, electrolyte imbalance, and neurological conditions such as ALS. Previous ENT evaluation for vertigo was negative. Neurological examination today was normal, but further evaluation is warranted due to family history and symptomatology. - Ordered comprehensive blood work including kidney and liver function, electrolytes, vitamin D , B12, thyroid  function, blood counts, iron panel, inflammation markers, and CK. - Referred to neurology for further evaluation. - Will consider MRI of the brain if blood  work is normal and neurology appointment is delayed. - Advised to wait for lab results before proceeding with MRI.  Anxiety symptoms related to physical health concerns Anxiety symptoms are reactive and related to physical health concerns, particularly the fear of ALS due to family history. Symptoms include crying spells and difficulty sleeping. Anxiety is exacerbated by uncertainty about physical symptoms and potential underlying conditions. She has a history of depression and previously tried Prozac, which was not well-tolerated. Currently managing anxiety with support from her husband and self-calming techniques. - Discussed potential use of anxiety medication if symptoms worsen or become unmanageable. - Encouraged use of self-calming techniques, such as exposure to cold air and breathing exercises, to manage anxiety. - Advised to monitor anxiety symptoms and report if they become unmanageable.        No orders of the defined types were placed in this encounter.    Return if symptoms worsen or fail to improve.      Jon Eva, MD  [facility] [phone] (phone) [phone] (fax)  [facility]

## 2024-05-08 LAB — CBC WITH DIFFERENTIAL/PLATELET
Basophils Absolute: 0.1 x10E3/uL (ref 0.0–0.2)
Basos: 1 %
EOS (ABSOLUTE): 0.2 x10E3/uL (ref 0.0–0.4)
Eos: 3 %
Hematocrit: 45.1 % (ref 34.0–46.6)
Hemoglobin: 14.5 g/dL (ref 11.1–15.9)
Immature Grans (Abs): 0 x10E3/uL (ref 0.0–0.1)
Immature Granulocytes: 0 %
Lymphocytes Absolute: 2.4 x10E3/uL (ref 0.7–3.1)
Lymphs: 35 %
MCH: 30.5 pg (ref 26.6–33.0)
MCHC: 32.2 g/dL (ref 31.5–35.7)
MCV: 95 fL (ref 79–97)
Monocytes Absolute: 0.5 x10E3/uL (ref 0.1–0.9)
Monocytes: 7 %
Neutrophils Absolute: 3.6 x10E3/uL (ref 1.4–7.0)
Neutrophils: 54 %
Platelets: 332 x10E3/uL (ref 150–450)
RBC: 4.76 x10E6/uL (ref 3.77–5.28)
RDW: 12.2 % (ref 11.7–15.4)
WBC: 6.9 x10E3/uL (ref 3.4–10.8)

## 2024-05-08 LAB — MAGNESIUM: Magnesium: 2.1 mg/dL (ref 1.6–2.3)

## 2024-05-08 LAB — SEDIMENTATION RATE: Sed Rate: 2 mm/h (ref 0–32)

## 2024-05-08 LAB — IRON,TIBC AND FERRITIN PANEL
Ferritin: 156 ng/mL — ABNORMAL HIGH (ref 15–150)
Iron Saturation: 31 % (ref 15–55)
Iron: 117 ug/dL (ref 27–159)
Total Iron Binding Capacity: 380 ug/dL (ref 250–450)
UIBC: 263 ug/dL (ref 131–425)

## 2024-05-08 LAB — COMPREHENSIVE METABOLIC PANEL WITH GFR
ALT: 19 IU/L (ref 0–32)
AST: 19 IU/L (ref 0–40)
Albumin: 4.5 g/dL (ref 3.9–4.9)
Alkaline Phosphatase: 43 IU/L (ref 41–116)
BUN/Creatinine Ratio: 10 (ref 9–23)
BUN: 9 mg/dL (ref 6–24)
Bilirubin Total: 0.4 mg/dL (ref 0.0–1.2)
CO2: 19 mmol/L — ABNORMAL LOW (ref 20–29)
Calcium: 9.9 mg/dL (ref 8.7–10.2)
Chloride: 103 mmol/L (ref 96–106)
Creatinine, Ser: 0.87 mg/dL (ref 0.57–1.00)
Globulin, Total: 2.4 g/dL (ref 1.5–4.5)
Glucose: 100 mg/dL — ABNORMAL HIGH (ref 70–99)
Potassium: 4.4 mmol/L (ref 3.5–5.2)
Sodium: 139 mmol/L (ref 134–144)
Total Protein: 6.9 g/dL (ref 6.0–8.5)
eGFR: 85 mL/min/1.73

## 2024-05-08 LAB — VITAMIN D 25 HYDROXY (VIT D DEFICIENCY, FRACTURES): Vit D, 25-Hydroxy: 38.4 ng/mL (ref 30.0–100.0)

## 2024-05-08 LAB — TSH: TSH: 1.93 u[IU]/mL (ref 0.450–4.500)

## 2024-05-08 LAB — VITAMIN B12: Vitamin B-12: 307 pg/mL (ref 232–1245)

## 2024-05-08 LAB — CK: Total CK: 75 U/L (ref 32–182)

## 2024-05-08 LAB — HIGH SENSITIVITY CRP: CRP, High Sensitivity: 2.57 mg/L (ref 0.00–3.00)

## 2024-05-11 ENCOUNTER — Encounter: Payer: Self-pay | Admitting: Family Medicine

## 2024-05-18 ENCOUNTER — Ambulatory Visit: Admitting: Family Medicine

## 2024-05-18 ENCOUNTER — Ambulatory Visit: Payer: Self-pay | Admitting: Family Medicine

## 2024-05-18 DIAGNOSIS — R42 Dizziness and giddiness: Secondary | ICD-10-CM

## 2024-05-18 DIAGNOSIS — Z820 Family history of epilepsy and other diseases of the nervous system: Secondary | ICD-10-CM

## 2024-05-18 DIAGNOSIS — R29818 Other symptoms and signs involving the nervous system: Secondary | ICD-10-CM

## 2024-05-23 ENCOUNTER — Ambulatory Visit

## 2024-05-26 ENCOUNTER — Other Ambulatory Visit: Payer: Self-pay | Admitting: Family Medicine

## 2024-05-26 ENCOUNTER — Ambulatory Visit (INDEPENDENT_AMBULATORY_CARE_PROVIDER_SITE_OTHER): Payer: Self-pay | Admitting: Family Medicine

## 2024-05-26 ENCOUNTER — Encounter: Payer: Self-pay | Admitting: Family Medicine

## 2024-05-26 ENCOUNTER — Other Ambulatory Visit (HOSPITAL_COMMUNITY)
Admission: RE | Admit: 2024-05-26 | Discharge: 2024-05-26 | Disposition: A | Source: Ambulatory Visit | Attending: Family Medicine | Admitting: Family Medicine

## 2024-05-26 VITALS — BP 114/68 | HR 80 | Resp 14 | Ht 68.0 in | Wt 151.2 lb

## 2024-05-26 DIAGNOSIS — N6489 Other specified disorders of breast: Secondary | ICD-10-CM

## 2024-05-26 DIAGNOSIS — Z0000 Encounter for general adult medical examination without abnormal findings: Secondary | ICD-10-CM | POA: Diagnosis not present

## 2024-05-26 DIAGNOSIS — Z1231 Encounter for screening mammogram for malignant neoplasm of breast: Secondary | ICD-10-CM

## 2024-05-26 DIAGNOSIS — R928 Other abnormal and inconclusive findings on diagnostic imaging of breast: Secondary | ICD-10-CM

## 2024-05-26 DIAGNOSIS — Z124 Encounter for screening for malignant neoplasm of cervix: Secondary | ICD-10-CM | POA: Diagnosis present

## 2024-05-26 NOTE — Progress Notes (Unsigned)
 "    Complete physical exam   Patient: Lauren Davis   DOB: 1980/12/16   44 y.o. Female  MRN: [id number] Visit Date: 05/26/2024  Today's healthcare provider: Jon Eva, MD   Chief Complaint  Patient presents with   Annual Exam   Subjective    Lauren Davis is a 44 y.o. female who presents today for a complete physical exam.   Discussed the use of AI scribe software for clinical note transcription with the patient, who gave verbal consent to proceed.  History of Present Illness   Lauren Davis is a 44 year old female who presents for an annual physical exam.  She previously had an abnormal mammogram followed by several normal six-month diagnostic and ultrasound follow-ups, which she stopped due to cost. She is unsure if she can now resume routine screening mammograms after being off follow-up for about a year.  She had difficulty completing a recommended MRI because of insurance preauthorization and timing with an insurance change.  Her last Pap smear was in 2020, more than five years ago.  She recalls receiving a flu shot and an MMR booster in 2018 or 2019 and believes she received hepatitis B vaccination in 2004 for work, though this is not documented in her record.        Last depression screening scores    05/26/2024    2:02 PM 05/07/2024    9:16 AM 05/07/2022    2:14 PM  PHQ 2/9 Scores  PHQ - 2 Score 2 1 0  PHQ- 9 Score 9 9 0      Data saved with a previous flowsheet row definition   Last fall risk screening    05/07/2024    9:22 AM  Fall Risk   Falls in the past year? 1  Number falls in past yr: 1  Injury with Fall? 0  Risk for fall due to : No Fall Risks  Follow up Falls evaluation completed        Medications: Show/hide medication list[1]  Review of Systems    Objective    BP 114/68   Pulse 80   Resp 14   Ht 5' 8 (1.727 m)   Wt 151 lb 3.2 oz (68.6 kg)   SpO2 99%   BMI 22.99 kg/m    Physical Exam Vitals reviewed.   Constitutional:      General: She is not in acute distress.    Appearance: Normal appearance. She is well-developed. She is not diaphoretic.  HENT:     Head: Normocephalic and atraumatic.     Right Ear: Tympanic membrane, ear canal and external ear normal.     Left Ear: Tympanic membrane, ear canal and external ear normal.     Nose: Nose normal.     Mouth/Throat:     Mouth: Mucous membranes are moist.     Pharynx: Oropharynx is clear. No oropharyngeal exudate.  Eyes:     General: No scleral icterus.    Conjunctiva/sclera: Conjunctivae normal.     Pupils: Pupils are equal, round, and reactive to light.  Neck:     Thyroid : No thyromegaly.  Cardiovascular:     Rate and Rhythm: Normal rate and regular rhythm.     Heart sounds: Normal heart sounds. No murmur heard. Pulmonary:     Effort: Pulmonary effort is normal. No respiratory distress.     Breath sounds: Normal breath sounds. No wheezing or rales.  Abdominal:     General: There is no  distension.     Palpations: Abdomen is soft.     Tenderness: There is no abdominal tenderness.  Genitourinary:    Comments: GYN:  External genitalia within normal limits.  Vaginal mucosa pink, moist, normal rugae.  Nonfriable cervix without lesions, no discharge or bleeding noted on speculum exam.   Chaperone present Musculoskeletal:        General: No deformity.     Cervical back: Neck supple.     Right lower leg: No edema.     Left lower leg: No edema.  Lymphadenopathy:     Cervical: No cervical adenopathy.  Skin:    General: Skin is warm and dry.     Findings: No rash.  Neurological:     Mental Status: She is alert and oriented to person, place, and time. Mental status is at baseline.     Gait: Gait normal.  Psychiatric:        Mood and Affect: Mood normal.        Behavior: Behavior normal.        Thought Content: Thought content normal.      No results found for any visits on 05/26/24.  Assessment & Plan    Routine Health  Maintenance and Physical Exam  Exercise Activities and Dietary recommendations  Goals   None     Immunization History  Administered Date(s) Administered   Influenza,inj,Quad PF,6+ Mos 02/26/2017, 03/09/2022   Influenza-Unspecified 03/06/2019, 03/11/2020, 03/16/2021, 03/08/2023, 03/13/2024   Janssen (J&J) SARS-COV-2 Vaccination 08/17/2019   PFIZER(Purple Top)SARS-COV-2 Vaccination 04/08/2020   Tdap 06/21/2015    Health Maintenance  Topic Date Due   Hepatitis B Vaccines 19-59 Average Risk (1 of 3 - 19+ 3-dose series) Never done   HPV VACCINES (1 - 3-dose SCDM series) Never done   Cervical Cancer Screening (HPV/Pap Cotest)  04/29/2024   Mammogram  07/02/2024   DTaP/Tdap/Td (2 - Td or Tdap) 06/20/2025   Influenza Vaccine  Completed   Hepatitis C Screening  Completed   HIV Screening  Completed   Pneumococcal Vaccine  Aged Out   Meningococcal B Vaccine  Aged Out   COVID-19 Vaccine  Discontinued    Discussed health benefits of physical activity, and encouraged her to engage in regular exercise appropriate for her age and condition.  Problem List Items Addressed This Visit       Other   Encounter for annual physical exam - Primary   Other Visit Diagnoses       Breast cancer screening by mammogram         Cervical cancer screening       Relevant Orders   Cytology - PAP           Woman's Wellness Visit Routine wellness visit with no new concerns. Previous abnormal mammogram with normal follow-up diagnostic and ultrasound. No current breast concerns. Pap smear due as last performed in 2020. Vaccination records incomplete, but she is confident of hepatitis B vaccination status. Recent flu shot received. - Ordered repeat mammogram. - Performed Pap smear today. - Ensured vaccination records are updated, particularly hepatitis B.        Return in about 1 year (around 05/26/2025) for CPE.     Jon Eva, MD  [facility] [phone]  (phone) [phone] (fax)  [facility]      [1]  Outpatient Medications Prior to Visit  Medication Sig   acyclovir  (ZOVIRAX ) 400 MG tablet Take 1 tablet (400 mg total) by mouth in the morning  and at bedtime.   albuterol  (VENTOLIN  HFA) 108 (90 Base) MCG/ACT inhaler Inhale 2 puffs into the lungs every 6 (six) hours as needed for wheezing or shortness of breath.   cetirizine (ZYRTEC) 10 MG chewable tablet Chew 10 mg by mouth as needed for allergies.   drospirenone -ethinyl estradiol  (JASMIEL ) 3-0.02 MG tablet Take 1 tablet by mouth daily. Takes continuously.   fexofenadine-pseudoephedrine (ALLEGRA-D 24) 180-240 MG 24 hr tablet Take 1 tablet by mouth daily as needed (allergies).   omeprazole  (PRILOSEC) 20 MG capsule Take 1 capsule (20 mg total) by mouth daily.   [DISCONTINUED] fluconazole  (DIFLUCAN ) 150 MG tablet Take 1 tablet (150 mg total) by mouth once as needed for up to 1 dose. For yeast infection. May repeat dose after 3-4 days if symptoms not resolved.   No facility-administered medications prior to visit.   "

## 2024-05-26 NOTE — Patient Instructions (Signed)
 Call [facility] to schedule a mammogram [phone]

## 2024-05-29 LAB — CYTOLOGY - PAP
Adequacy: ABSENT
Comment: NEGATIVE
Diagnosis: NEGATIVE
High risk HPV: NEGATIVE

## 2024-06-02 ENCOUNTER — Ambulatory Visit: Payer: Self-pay | Admitting: Family Medicine

## 2024-06-15 ENCOUNTER — Other Ambulatory Visit

## 2024-06-15 ENCOUNTER — Encounter

## 2024-06-17 ENCOUNTER — Ambulatory Visit
Admission: RE | Admit: 2024-06-17 | Discharge: 2024-06-17 | Disposition: A | Discharge status: Home / Self Care | Source: Ambulatory Visit | Attending: Family Medicine | Admitting: Family Medicine

## 2024-06-17 DIAGNOSIS — Z820 Family history of epilepsy and other diseases of the nervous system: Secondary | ICD-10-CM | POA: Diagnosis present

## 2024-06-17 DIAGNOSIS — R42 Dizziness and giddiness: Secondary | ICD-10-CM | POA: Diagnosis present

## 2024-06-17 DIAGNOSIS — R29818 Other symptoms and signs involving the nervous system: Secondary | ICD-10-CM | POA: Insufficient documentation

## 2024-06-17 MED ORDER — GADOBUTROL 1 MMOL/ML IV SOLN
7.0000 mL | Freq: Once | INTRAVENOUS | Status: AC | PRN
  Administered 2024-06-17: 7 mL via INTRAVENOUS

## 2024-06-18 ENCOUNTER — Ambulatory Visit
Admission: RE | Admit: 2024-06-18 | Discharge: 2024-06-18 | Disposition: A | Discharge status: Home / Self Care | Source: Ambulatory Visit | Attending: Family Medicine | Admitting: Family Medicine

## 2024-06-18 DIAGNOSIS — Z1231 Encounter for screening mammogram for malignant neoplasm of breast: Secondary | ICD-10-CM | POA: Diagnosis present

## 2024-06-18 DIAGNOSIS — N6489 Other specified disorders of breast: Secondary | ICD-10-CM

## 2024-06-22 ENCOUNTER — Ambulatory Visit: Payer: Self-pay | Admitting: Family Medicine

## 2024-06-22 ENCOUNTER — Other Ambulatory Visit: Payer: Self-pay

## 2024-06-22 ENCOUNTER — Encounter: Payer: Self-pay | Admitting: Family Medicine

## 2024-06-22 ENCOUNTER — Other Ambulatory Visit: Payer: Self-pay | Admitting: Family Medicine

## 2024-06-22 DIAGNOSIS — Z3041 Encounter for surveillance of contraceptive pills: Secondary | ICD-10-CM

## 2024-06-22 MED ORDER — DROSPIRENONE-ETHINYL ESTRADIOL 3-0.02 MG PO TABS
1.0000 | ORAL_TABLET | Freq: Every day | ORAL | 3 refills | Status: AC
  Filled 2024-06-22: qty 84, 84d supply, fill #0

## 2025-05-31 ENCOUNTER — Ambulatory Visit: Admitting: Family Medicine
